# Patient Record
Sex: Female | Born: 1990 | Race: White | Hispanic: No | Marital: Married | Smoking: Former smoker
Health system: Southern US, Community
[De-identification: ages and names within clinical notes are randomized; demographics above are authoritative.]

## PROBLEM LIST (undated history)

## (undated) ENCOUNTER — Observation Stay: Admission: AD | Payer: Medicaid Other | Source: Ambulatory Visit | Admitting: Obstetrics & Gynecology

## (undated) DIAGNOSIS — F419 Anxiety disorder, unspecified: Secondary | ICD-10-CM

## (undated) DIAGNOSIS — D649 Anemia, unspecified: Secondary | ICD-10-CM

## (undated) DIAGNOSIS — I10 Essential (primary) hypertension: Secondary | ICD-10-CM

## (undated) DIAGNOSIS — O139 Gestational [pregnancy-induced] hypertension without significant proteinuria, unspecified trimester: Secondary | ICD-10-CM

## (undated) HISTORY — PX: DRUG INDUCED ENDOSCOPY: SHX6808

## (undated) HISTORY — PX: WISDOM TOOTH EXTRACTION: SHX21

## (undated) HISTORY — DX: Gestational (pregnancy-induced) hypertension without significant proteinuria, unspecified trimester: O13.9

## (undated) HISTORY — DX: Anxiety disorder, unspecified: F41.9

## (undated) HISTORY — DX: Anemia, unspecified: D64.9

## (undated) HISTORY — PX: ENDOSCOPY UPPER GI: SHX510245

## (undated) HISTORY — DX: Essential (primary) hypertension: I10

---
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
## 2012-03-25 NOTE — Op Note (Unsigned)
DATE OF BIRTH:                        05-Mar-1991      ADMISSION DATE:                     05/08/2004            PATIENT LOCATION:                    [id number]            DATE OF PROCEDURE:                   05/09/2004      SURGEON:                            Marguerite Olea, MD      ASSISTANT(S):                  PREOPERATIVE DIAGNOSIS:  ESOPHAGEAL FOREIGN BODY.            POSTOPERATIVE DIAGNOSIS:  STATUS POST REMOVAL OF ESOPHAGEAL FOREIGN BODY.            PROCEDURE:  ESOPHAGOGASTRODUODENOSCOPY WITH FOREIGN BODY REMOVAL.            INDICATIONS:  The patient is a 22 year old female who swallowed a guitar      pick yesterday.  She presented with dysphagia, drooling and chest pain.            DESCRIPTION OF PROCEDURE:  The instrument was done under intravenous      sedation provided by the anesthesiology department.  The patient was placed      in the left lateral decubitus position.  The Olympus endoscope was      carefully induced into the oropharynx and advanced down the esophagus.      There was a guitar pick present in the middle esophagus.  Mathieu forceps      were extended and the foreign body removed easily.  The scope was then      reintroduced. She had some minor superficial erosions in the middle of the      esophagus.  The scope was advanced down the length of the entire esophagus.      No other foreign bodies were identified.  The scope was then removed.  She      tolerated the procedure well and went back to recovery.            The plan is to advance diet as tolerated and discharge home if tolerating      p.o. well.                                                ___________________________________          Date Signed: __________      Marguerite Olea, MD  ([id number])            D: 05/09/2004 by Marguerite Olea, MD      T: 05/09/2004 by [id number] ([id number]) ([id number])      cc:  Marguerite Olea, MD

## 2013-04-05 ENCOUNTER — Emergency Department
Admission: EM | Admit: 2013-04-05 | Discharge: 2013-04-05 | Disposition: A | Discharge status: Home / Self Care | Payer: Enrolled Prime—HMO | Attending: Emergency Medicine | Admitting: Emergency Medicine

## 2013-04-05 ENCOUNTER — Emergency Department: Payer: Enrolled Prime—HMO

## 2013-04-05 DIAGNOSIS — O200 Threatened abortion: Secondary | ICD-10-CM | POA: Insufficient documentation

## 2013-04-05 LAB — CBC AND DIFFERENTIAL
Basophils Absolute Automated: 0.02 10*3/uL (ref 0.00–0.20)
Basophils Automated: 0 %
Eosinophils Absolute Automated: 0.12 10*3/uL (ref 0.00–0.70)
Eosinophils Automated: 1 %
Hematocrit: 34.6 % — ABNORMAL LOW (ref 37.0–47.0)
Hgb: 11.9 g/dL — ABNORMAL LOW (ref 12.0–16.0)
Immature Granulocytes Absolute: 0.03 10*3/uL
Immature Granulocytes: 0 %
Lymphocytes Absolute Automated: 2.03 10*3/uL (ref 0.50–4.40)
Lymphocytes Automated: 22 %
MCH: 29.8 pg (ref 28.0–32.0)
MCHC: 34.4 g/dL (ref 32.0–36.0)
MCV: 86.5 fL (ref 80.0–100.0)
MPV: 9.2 fL — ABNORMAL LOW (ref 9.4–12.3)
Monocytes Absolute Automated: 0.62 10*3/uL (ref 0.00–1.20)
Monocytes: 7 %
Neutrophils Absolute: 6.43 10*3/uL (ref 1.80–8.10)
Neutrophils: 70 %
Nucleated RBC: 0 (ref 0–1)
Platelets: 233 10*3/uL (ref 140–400)
RBC: 4 10*6/uL — ABNORMAL LOW (ref 4.20–5.40)
RDW: 11 % — ABNORMAL LOW (ref 12–15)
WBC: 9.22 10*3/uL (ref 3.50–10.80)

## 2013-04-05 LAB — HCG QUANTITATIVE: hCG, Quant.: 49806.5

## 2013-04-05 LAB — TYPE AND SCREEN
AB Screen Gel: NEGATIVE
ABO Rh: A POS

## 2013-04-05 NOTE — ED Provider Notes (Signed)
Physician/Midlevel provider first contact with patient: 04/05/13 1610         EMERGENCY DEPARTMENT HISTORY AND PHYSICAL EXAM    Patient Name: Rhonda Rhonda [name]  Encounter Date:  04/05/2013  Attending Physician: Elray Mcgregor, MD  Patient DOB:  10-Jan-1991  MRN:  [id number]  Room:  01B/A01B    History of Presenting Illness     Historian: Pt    The patient Rhonda Rhonda [name] is Rhonda [name] P1G0Ab0 23 y.o. female [redacted] weeks pregnant p/w intermittent, pink, mild vaginal bleeding since 2 hours ago. Associated with cramping abd pain. No fever or nausea.    OB: Rhonda Rhonda [name]  PMD:  No primary provider on file.    Nursing Notes Review  Nursing Notes were reviewed.     Previous Records Review  Previous records were reviewed to the extent practicable for the current presentation.    Past Medical History     History reviewed. No pertinent past medical history.    No current facility-administered medications for this encounter.  Current outpatient prescriptions:Prenatal Vit-Fe Fumarate-FA (PRENATAL PO), Take by mouth., Disp: , Rfl:     No Known Allergies    Past Surgical History     Past Surgical History   Procedure Date   . Endoscopy upper gi        Family History   The family history is not significantly contributory to current presentation.   History reviewed. No pertinent family history.    Social History   Social history is not significantly contributory to the patient's current presentation.   History     Social History   . Marital Status: Single     Spouse Name: N/Rhonda [name]     Number of Children: N/Rhonda [name]   . Years of Education: N/Rhonda [name]     Social History Main Topics   . Smoking status: Former Games developer   . Smokeless tobacco: Not on file   . Alcohol Use: Yes      Comment: quit since pregnancy   . Drug Use: Yes      Comment: before pregnancy   . Sexually Active: Not on file     Other Topics Concern   . Not on file     Social History Narrative   . No narrative on file       Home Medications     Home medications reviewed by ED MD at 7:23 AM      Previous Medications    PRENATAL VIT-FE FUMARATE-FA (PRENATAL PO)    Take by mouth.       Review of Systems     See HPI for review of systems that is relevant to the current presentation.   All other systems reviewed: negative.     Physical Exam     Vital Signs  BP 125/63  Pulse 82  Temp 97.9 F (36.6 C)  Resp 16  Ht 1.651 m  Wt 69.4 kg  BMI 25.46 kg/m2  SpO2 99%  LMP 01/05/2013    Review of Vital Signs  The patient's vital signs were reviewed and interpreted by me, Elray Mcgregor, MD.    Physical Exam    Constitutional: No acute distress. Not ashen.   Eyes: No conjunctival pallor. No conjunctival discharge. EOMI.   Head, Ears, Nose, Mouth, Throat: Normocephalic. Moist mucous membranes.   Neck: Full range of motion. No obvious neck deformities. No tracheal deviation.   Cardiovascular: RRR. No obvious friction rubs or gallops. Normal capillary refill.   Respiratory/Chest: CTAB. No obvious  chest wall asymmetry. No resp distress.    Gastrointestinal/Abdominal:  No abdominal tenderness. Soft abdomen. No abdominal distention.   Musculoskeletal: Normal ROM. No focal extremity tenderness.   Back: No deformity. No significant scoliosis.   Neurological: Alert. No acute focal deficits.   Skin: Warm skin. No acute rash.     ED Medications Administered     ED Medication Orders     None          Orders Placed During This Encounter     Orders Placed This Encounter   Procedures   . US OB < 14 Weeks W Transvag   . UA, Reflex to Microscopic   . CBC and differential   . Beta HCG, Quant, Serum   . Diet NPO effective now   . Type and Screen   . Saline lock IV       Diagnostic Study Results     The results of the diagnostic studies below were reviewed by the ED provider:    Labs  Results     Procedure Component Value Units Date/Time    Type and Screen [[id number]] Collected:04/05/13 0647    Specimen Information:Blood Updated:04/05/13 0744     ABO Rh Rhonda [name] POS      AB Screen Gel NEG     Beta HCG, Quant, Serum [[id number]]  Collected:04/05/13 0647     hCG, Quant. 14782.9 Updated:04/05/13 0733    CBC and differential [[id number]]  (Abnormal) Collected:04/05/13 0647    Specimen Information:Blood / Blood Updated:04/05/13 0658     WBC 9.22 x10 3/uL      RBC 4.00 (L) x10 6/uL      Hgb 11.9 (L) g/dL      Hematocrit 57.8 (L) %      MCV 86.5 fL      MCH 29.8 pg      MCHC 34.4 g/dL      RDW 11 (L) %      Platelets 233 x10 3/uL      MPV 9.2 (L) fL      Neutrophils 70 %      Lymphocytes Automated 22 %      Monocytes 7 %      Eosinophils Automated 1 %      Basophils Automated 0 %      Immature Granulocyte 0 %      Nucleated RBC 0      Neutrophils Absolute 6.43 x10 3/uL      Abs Lymph Automated 2.03 x10 3/uL      Abs Mono Automated 0.62 x10 3/uL      Abs Eos Automated 0.12 x10 3/uL      Absolute Baso Automated 0.02 x10 3/uL      Absolute Immature Granulocyte 0.03 x10 3/uL           Radiologic Studies  Radiology Results (24 Hour)     Procedure Component Value Units Date/Time    US OB < 14 Rhonda Rhonda [name] Transvag [[id number]] Collected:04/05/13 0719    Order Status:Completed  Updated:04/05/13 0727    Narrative:    INDICATION: 23 year old pregnant female presents with vaginal bleeding.  Threatened abortion.  Rule out ectopic pregnancy.LMP: 01/05/2013. EDC  based on LMP: 10/12/2013. Gestational age based on LMP:12 weeks and 6  days.         COMPARISON: No available prior study with which to compare.     TECHNIQUE: OBSTETRICAL ULTRASOUND: Real-time transabdominal and  transvaginal scans of the pelvis was performed with the help of Doppler.  FINDINGS: The uterus is anteverted measuring approximately 14.9 x 6.9 x  10.4 cm.  There is Rhonda [name] single live intrauterine gestation.     Fetal  cardiac rate is 158 BPM. No subchorionic hemorrhage is appreciated. The  cervix is closed. Amniotic fluid volume is qualitatively normal. Fetal  anatomy cannot be assessed at this early stage of gestation.          The right ovary measures 2.1 x 2.3 x 1.9 cm. The left ovary measures  3.1  x 2.4 x 1.9 cm.         The crown rump length is: 5.8 cm  The gestational age estimate by crown-rump length is:  12 weeks and 3  days  EDC by ultrasound is 10/16/2013.          Impression:      Obstetrical ultrasound shows an intrauterine gestation. The  single living intrauterine gestation has Rhonda [name] gestational age estimate of  12 weeks and 3 days.     Neldon Mc, MD   04/05/2013 7:23 AM          Scribe and MD Attestations     I, Elray Mcgregor, MD, personally performed the services documented. Crisoforo Oxford is scribing for me on O'DONNELL,Rhonda Rhonda [name]. I reviewed and confirm the accuracy of the information in this medical record.     I, Crisoforo Oxford, am serving as Rhonda [name] Neurosurgeon to document services personally performed by Elray Mcgregor, MD, based on the provider's statements to me.     Rendering Provider: Elray Mcgregor, MD    ED Course, Monitors, EKG, Critical Care, Splints, Consults, Reevaluation, etc     n/Rhonda [name]    MDM and Clinical Notes     Hx and Exam Synthesis and Differential Diagnosis     MDM: First trimester bleeding. Unlikely to represent miscarriage. Pelvic exam is low yield at this point given that pt has already had her [place] and minimal bleeding.     Plan    Plan: [place]. Labs. Blood type.    Return Precautions:    I counseled extensively on nature of problem--voiced understanding, agrees to follow up. Given strict return precautions--fully understands:   Pt is to return immediately to emergency department if any worsening symptoms at all--otherwise, return to the emergency department within 2 days for Rhonda [name] recheck or follow up with obstetrician within 2 days.    Diagnosis and Disposition     Clinical Impression  1. Threatened miscarriage        Disposition  ED Disposition     Discharge Rhonda Rhonda [name] discharge to home/self care.    Condition at disposition: Stable            Prescriptions     New Prescriptions    No medications on file         Elray Mcgregor, MD  04/05/13 1454

## 2013-04-05 NOTE — Discharge Instructions (Signed)
Dear Mitchel Honour,    Return immediately to the emergency room if you have any worsening symptoms!!! Otherwise, follow up with your obstetrician in the next 2 days, or return to the emergency department in 2 days if you have any trouble getting an appointment.     Thank you for choosing [facility] for your emergency care needs.  We strive to provide EXCELLENT care to you and your family.      If you do not continue to improve or your condition worsens, please contact your doctor or return immediately to the Emergency Department.    DOCTOR REFERRALS  Call [phone] (available 24 hours a day, 7 days a week) if you need any further referrals and we can help you find a primary care doctor or specialist.  Also, available online at:  [email or web address]    YOUR CONTACT INFORMATION  Before leaving please check with registration to make sure we have an up-to-date contact number.  You can call registration at [phone] to update your information.  For questions about your hospital bill, please call [phone].  For questions about your Emergency Dept Physician bill please call [phone].      FREE HEALTH SERVICES  If you need help with health or social services, please call 2-1-1 for a free referral to resources in your area.  2-1-1 is a free service connecting people with information on health insurance, free clinics, pregnancy, mental health, dental care, food assistance, housing, and substance abuse counseling.  Also, available online at:  http://www.[facility].org    MEDICAL RECORDS AND TESTS  Certain laboratory test results do not come back the same day, for example urine cultures.   We will contact you if other important findings are noted.  Radiology films are often reviewed again to ensure accuracy.  If there is any discrepancy, we will notify you.      Please call [phone] to pick up a complimentary CD of any radiology studies performed.  If you or your  doctor would like to request a copy of your medical records, please call [phone].      ORTHOPEDIC INJURY   Please know that significant injuries can exist even when an initial x-ray is read as normal or negative.  This can occur because some fractures (broken bones) are not initially visible on x-rays.  For this reason, close outpatient follow-up with your primary care doctor or bone specialist (orthopedist) is required.    MEDICATIONS AND FOLLOWUP  Please be aware that some prescription medications can cause drowsiness.  Use caution when driving or operating machinery.    The examination and treatment you have received in our Emergency Department is provided on an emergency basis, and is not intended to be a substitute for your primary care physician.  It is important that your doctor checks you again and that you report any new or remaining problems at that time.      24 HOUR PHARMACIES  [facility] - [address] [place], [place] [address] (1.4 miles, 7 minutes)  [facility] - [address] [place], [place] [address] (6.5 miles, 13 minutes)  Handout with directions available on request

## 2013-04-05 NOTE — ED Notes (Signed)
Pt states awakened at 0500 and noted some light pink vaginal drainage on tissue after urination; pt is [redacted] weeks pregnant; c/o some cramping; pt has had an [place] which shows normal pregnancy.

## 2013-04-05 NOTE — ED Notes (Signed)
This nurse seeing patient for first time.

## 2016-03-23 NOTE — L&D Delivery Note (Signed)
Delivery Summary for Rhonda [name],  No LMP recorded. Patient is pregnant., Estimated Date of Delivery: 10/01/16    Pt admitted at: 09/24/2016  6:49 PM    Patient Active Problem List   Diagnosis   . [redacted] weeks gestation of pregnancy   . Pregnancy with 39 completed weeks gestation       Time of Delivery: 0414  09/25/2016     Attending/Supervising MD:  Dionicia Abler    Delivering Provider: Dionicia Abler    Procedure: Spontaneous vaginal delivery or Repair first degree spontaneous laceration    Anesthesia: Epidural     A female  infant was delivered from  [facility].    Infant Wt:      8 lb 4.3 oz (3750 g)    Infant Name:   Terese Door      Apgars:  8  at and 9  at 5 minutes    ROM Time: 0311 09/25/16  Amniotic Fluid: Clear       Placenta and Cord:          Mechanism: spontaneous      Description:  intact    Episiotomy: None     Lacerations: Yes    Laceration Type: 1st    Episiotomy/Laceration Repair:  2-0 vicryl in layers and   Small labial lacerations repaired subcutaneously     Estimated Blood Loss:  100cc's        Specimens: None  Disposition: discarded           Complications:  none  Sponge and instrument counts correct.

## 2016-04-16 DIAGNOSIS — A749 Chlamydial infection, unspecified: Secondary | ICD-10-CM

## 2016-04-16 HISTORY — DX: Chlamydial infection, unspecified: A74.9

## 2016-04-16 LAB — RUBELLA ANTIBODY, IGG: Rubella AB, IgG: IMMUNE

## 2016-04-16 LAB — RPR: RPR: NONREACTIVE

## 2016-04-16 LAB — HIV AG/AB 4TH GENERATION: HIV Ag/Ab, 4th Generation: NONREACTIVE

## 2016-04-16 LAB — HEPATITIS B SURFACE ANTIGEN W/ REFLEX TO CONFIRMATION: Hepatitis B Surface Antigen: NEGATIVE

## 2016-06-12 LAB — GONOCOCCUS CULTURE
Chlamydia trachomatis Culture: NEGATIVE
Culture Gonorrhoeae: NEGATIVE

## 2016-06-30 ENCOUNTER — Observation Stay: Payer: Medicaid Other

## 2016-06-30 ENCOUNTER — Observation Stay: Payer: Medicaid Other | Admitting: Obstetrics & Gynecology

## 2016-06-30 ENCOUNTER — Observation Stay
Admission: AD | Admit: 2016-06-30 | Discharge: 2016-06-30 | Disposition: A | Discharge status: Home / Self Care | Payer: Medicaid Other | Source: Ambulatory Visit | Attending: Obstetrics & Gynecology | Admitting: Obstetrics & Gynecology

## 2016-06-30 ENCOUNTER — Encounter (HOSPITAL_BASED_OUTPATIENT_CLINIC_OR_DEPARTMENT_OTHER): Payer: Self-pay

## 2016-06-30 DIAGNOSIS — Z3A26 26 weeks gestation of pregnancy: Secondary | ICD-10-CM | POA: Insufficient documentation

## 2016-06-30 DIAGNOSIS — N898 Other specified noninflammatory disorders of vagina: Principal | ICD-10-CM | POA: Insufficient documentation

## 2016-06-30 DIAGNOSIS — O99012 Anemia complicating pregnancy, second trimester: Secondary | ICD-10-CM | POA: Insufficient documentation

## 2016-06-30 DIAGNOSIS — O162 Unspecified maternal hypertension, second trimester: Secondary | ICD-10-CM | POA: Insufficient documentation

## 2016-06-30 DIAGNOSIS — O26892 Other specified pregnancy related conditions, second trimester: Secondary | ICD-10-CM | POA: Insufficient documentation

## 2016-06-30 LAB — URINALYSIS WITH MICROSCOPIC
Bilirubin, UA: NEGATIVE
Blood, UA: NEGATIVE
Glucose, UA: NEGATIVE
Ketones UA: NEGATIVE
Leukocyte Esterase, UA: NEGATIVE
Nitrite, UA: NEGATIVE
Protein, UR: NEGATIVE
Specific Gravity UA: 1.009 (ref 1.001–1.035)
Urine pH: 8 (ref 5.0–8.0)
Urobilinogen, UA: NORMAL mg/dL

## 2016-06-30 NOTE — Progress Notes (Signed)
UPDATE  Fern negative (but had used terazol recently)  Await sono

## 2016-06-30 NOTE — H&P (Signed)
Subjective:   Rhonda [name] is a 26 y.o. G2P1001 female at [redacted]w[redacted]d, Estimated Date of Delivery: 10/01/16.  She is being admitted for leakage of fluid.  This morning she noted a large puddle of fluid which gushed from her vagina and she has continued to lerak since that time.  Her current obstetrical history is significant for currently being under treatment for yeast with intravaginal Terazol  Patient reports rare mild contractions  Fetal Movement: normal.    Historical Data:  Past Medical History:   Diagnosis Date   . Anemia    . Chlamydia 04/16/2016   . Hypertension     chronic but not on meds and gestational with first pregnancy     Past Surgical History:   Procedure Laterality Date   . ENDOSCOPY UPPER GI     . WISDOM TOOTH EXTRACTION       OB History   Gravida Para Term Preterm AB Living   2 1 1  0 0 1   SAB TAB Ectopic Multiple Live Births   0 0 0 0 1      # Outcome Date GA Lbr Len/2nd Weight Sex Delivery Anes PTL Lv   2 Current            1 Term 10/03/13    F Vag-Spont EPI  LIV          Meds:  Prescriptions Prior to Admission   Medication Sig Dispense Refill Last Dose   . Prenatal Vit-Fe Fumarate-FA (PRENATAL PO) Take by mouth.   06/29/2016 at Unknown time   . terconazole (TERAZOL 7) 0.4 % vaginal cream Place 1 applicator vaginally nightly.   06/29/2016 at Unknown time         All        Vital signs in last 24 hours:  Temp:  [98.3 F (36.8 C)] 98.3 F (36.8 C)  Resp Rate:  [20] 20     FHTS: 140 Cat 1  Toco:  Rare contractions    General:   appears stated age, cooperative   Lungs:   Clear to auscultation bilaterally   Heart:    Regular rate and rhythm, no murmer, no rub, no gallop   Abdomen:   Soft, nonender, normal active bowel sounds, gravid uterus   SVE:  Sterile speculum exam with minimal white watery fluid at top of vault.  Cervix visually closed.     Lab Review:  Lab Results   Component Value Date    ABORH A POS 04/05/2013    RUBELLAABIGG Immune 04/16/2016    HEPBSAG Negative 04/16/2016         Assessment:   26 y.o. G2P1001 female at [redacted]w[redacted]d   Leakage of fluid   Currently using Terazol cream intravaginally, which limits ability to evaluate for PROM with vaginal testing   Recent BV and yeast infection    Plan:   Admit to L&D for evaluatioon   Fern test   [employer]   Sono to assess for remaining fluid and cervical length

## 2016-06-30 NOTE — Discharge Summary (Signed)
Discharge Dx:   26 week pregnancy  No evidence of PPROM  Sono:  ize consistent with dates, normal AFI and long cervix      Procedures performed:   Fetal monitoring  OB [facility] Course:   Uncomplicated                Discharge Medications:          Discharge Medication List as of 06/30/2016  3:56 PM           CONTINUE these medications which have NOT CHANGED    Details   Prenatal Vit-Fe Fumarate-FA (PRENATAL PO) Take by mouth., Until Discontinued, Historical Med   Terconazole to complete series          STOP taking these medications

## 2016-06-30 NOTE — Progress Notes (Signed)
Dr Dareen Piano given report on pt

## 2016-06-30 NOTE — Discharge Instr - AVS First Page (Signed)
Reason for your Hospital Admission:  Leaking fluid      Instructions for after your discharge:  Call MD if leak fluid or contract again

## 2016-06-30 NOTE — Progress Notes (Signed)
EFM & TOCO reapplied after [place] completed.

## 2016-06-30 NOTE — Progress Notes (Signed)
Pt given verbal [place] IS by Dr Dareen Piano and left prior to being given written [place] IS.  Pt [place] to home undelivered.

## 2016-06-30 NOTE — Progress Notes (Signed)
Dr Dareen Piano in and exam completed.

## 2016-06-30 NOTE — Hospital Course (Signed)
DISCHARGE NOTE    Date Time: 06/30/16 4:03 PM  Patient Name: Rhonda [name],Rhonda [name]  Attending Physician: No att. providers found    Date of Admission:   06/30/2016    Date of Discharge:   06/30/16    Reason for Admission:   Encounter for supervision of other normal pregnancy, unspecified trimester [Z34.80]   G2P1 at 26 5/7 weeks leaking fluid.  Currently under treatment for vaginal yeast infection so couldn't rely on ROM test.  Sterile spec exam revealed [name] small amount of thin white vafluid in vagina.  Fern negative.  Sono showed normal AFI = 16 .4.  Sono consistent with dates.  Cervical length = 4.0 cm.  Did not leak fluid during entire hospitalization. UA negative, urine culture pnd.    Problems:   Lists the present on admission hospital problems  Leaked fluid of uncertain origin    Hospital Problems:  Active Problems:    [redacted] weeks gestation of pregnancy      Problem Lists:  Patient Active Problem List   Diagnosis   . [redacted] weeks gestation of pregnancy                  Discharge Dx:   26 week pregnancy  No evidence of PPROM  Sono:  ize consistent with dates, normal AFI and long cervix      Procedures performed:   Fetal monitoring  OB [facility] Course:   Uncomplicated     Discharge Medications:     Discharge Medication List as of 06/30/2016  3:56 PM      CONTINUE these medications which have NOT CHANGED    Details   Prenatal Vit-Fe Fumarate-FA (PRENATAL PO) Take by mouth., Until Discontinued, Historical Med   Terconazole to complete series      STOP taking these medications                       Discharge Instructions:       Follow-up with Ob/Gyn in 6 - 7 days   To call MD if symptoms return      Signed by: Daine Gip          Relevant Summary for Hospital Course:

## 2016-06-30 NOTE — Progress Notes (Signed)
Dr Dareen Piano called to give her [place] report on pt.  States she will be in shortly to see pt

## 2016-06-30 NOTE — Progress Notes (Signed)
[place] here.  Monitor dcd.

## 2016-06-30 NOTE — Progress Notes (Signed)
Patient is a 25y/o g2p1 with a edc of 10/01/16. She came in with the c/o leaking fluid this morning.  EFM/EUM applied and explained. History taken.

## 2016-06-30 NOTE — Progress Notes (Signed)
[place] tech @ BS performing bedside US on pt

## 2016-08-26 LAB — GROUP B STREP TRANSCRIBED: GBS Transcribed: NEGATIVE

## 2016-09-10 ENCOUNTER — Ambulatory Visit: Payer: Self-pay

## 2016-09-24 ENCOUNTER — Inpatient Hospital Stay
Admission: RE | Admit: 2016-09-24 | Discharge: 2016-09-27 | DRG: 560 | Disposition: A | Discharge status: Home / Self Care | Payer: Medicaid Other | Source: Ambulatory Visit | Attending: Obstetrics & Gynecology | Admitting: Obstetrics & Gynecology

## 2016-09-24 ENCOUNTER — Inpatient Hospital Stay: Admission: RE | Admit: 2016-09-24 | Payer: Medicaid Other | Source: Ambulatory Visit

## 2016-09-24 DIAGNOSIS — O3663X0 Maternal care for excessive fetal growth, third trimester, not applicable or unspecified: Principal | ICD-10-CM | POA: Diagnosis present

## 2016-09-24 DIAGNOSIS — Z3A39 39 weeks gestation of pregnancy: Secondary | ICD-10-CM

## 2016-09-24 DIAGNOSIS — Z87891 Personal history of nicotine dependence: Secondary | ICD-10-CM

## 2016-09-24 LAB — CBC AND DIFFERENTIAL
Absolute NRBC: 0 10*3/uL
Basophils Absolute Automated: 0.04 10*3/uL (ref 0.00–0.20)
Basophils Automated: 0.4 %
Eosinophils Absolute Automated: 0.13 10*3/uL (ref 0.00–0.70)
Eosinophils Automated: 1.5 %
Hematocrit: 32.1 % — ABNORMAL LOW (ref 37.0–47.0)
Hgb: 10.6 g/dL — ABNORMAL LOW (ref 12.0–16.0)
Immature Granulocytes Absolute: 0.06 10*3/uL — ABNORMAL HIGH
Immature Granulocytes: 0.7 %
Lymphocytes Absolute Automated: 2.73 10*3/uL (ref 0.50–4.40)
Lymphocytes Automated: 30.6 %
MCH: 29.5 pg (ref 28.0–32.0)
MCHC: 33 g/dL (ref 32.0–36.0)
MCV: 89.4 fL (ref 80.0–100.0)
MPV: 10.1 fL (ref 9.4–12.3)
Monocytes Absolute Automated: 0.75 10*3/uL (ref 0.00–1.20)
Monocytes: 8.4 %
Neutrophils Absolute: 5.21 10*3/uL (ref 1.80–8.10)
Neutrophils: 58.4 %
Nucleated RBC: 0 /100 WBC (ref 0.0–1.0)
Platelets: 197 10*3/uL (ref 140–400)
RBC: 3.59 10*6/uL — ABNORMAL LOW (ref 4.20–5.40)
RDW: 12 % (ref 12–15)
WBC: 8.92 10*3/uL (ref 3.50–10.80)

## 2016-09-24 LAB — TYPE AND SCREEN
AB Screen Gel: NEGATIVE
ABO Rh: A POS

## 2016-09-24 MED ORDER — TERBUTALINE SULFATE 1 MG/ML IJ SOLN
0.2500 mg | Freq: Once | INTRAMUSCULAR | Status: DC | PRN
Start: 2016-09-24 — End: 2016-09-25

## 2016-09-24 MED ORDER — ONDANSETRON 4 MG PO TBDP
4.0000 mg | ORAL_TABLET | Freq: Three times a day (TID) | ORAL | Status: DC | PRN
Start: 2016-09-24 — End: 2016-09-27

## 2016-09-24 MED ORDER — PROMETHAZINE HCL 25 MG/ML IJ SOLN
6.2500 mg | Freq: Four times a day (QID) | INTRAMUSCULAR | Status: DC | PRN
Start: 2016-09-24 — End: 2016-09-25

## 2016-09-24 MED ORDER — PROMETHAZINE HCL 25 MG RE SUPP
12.5000 mg | Freq: Four times a day (QID) | RECTAL | Status: DC | PRN
Start: 2016-09-24 — End: 2016-09-25

## 2016-09-24 MED ORDER — CALCIUM CARBONATE ANTACID 500 MG PO CHEW
500.0000 mg | CHEWABLE_TABLET | Freq: Four times a day (QID) | ORAL | Status: DC | PRN
Start: 2016-09-24 — End: 2016-09-27
  Administered 2016-09-24: 500 mg via ORAL
  Filled 2016-09-24 (×2): qty 1

## 2016-09-24 MED ORDER — DINOPROSTONE 10 MG VA INST
10.0000 mg | VAGINAL_INSERT | Freq: Once | VAGINAL | Status: AC
Start: 2016-09-24 — End: 2016-09-24
  Administered 2016-09-24: 19:00:00 10 mg via VAGINAL
  Filled 2016-09-24: qty 1

## 2016-09-24 MED ORDER — ACETAMINOPHEN 325 MG PO TABS
650.0000 mg | ORAL_TABLET | Freq: Four times a day (QID) | ORAL | Status: DC | PRN
Start: 2016-09-24 — End: 2016-09-25

## 2016-09-24 MED ORDER — FENTANYL-BUPIVACAINE-NACL 0.2-0.125-0.9 MG/100ML-% EP SOLN
EPIDURAL | Status: DC
Start: 2016-09-25 — End: 2016-09-25
  Administered 2016-09-25: 01:00:00 10 mL/h via EPIDURAL
  Filled 2016-09-24: qty 100

## 2016-09-24 MED ORDER — PROMETHAZINE HCL 25 MG PO TABS
12.5000 mg | ORAL_TABLET | Freq: Four times a day (QID) | ORAL | Status: DC | PRN
Start: 2016-09-24 — End: 2016-09-25

## 2016-09-24 MED ORDER — ONDANSETRON HCL 4 MG/2ML IJ SOLN
4.0000 mg | Freq: Three times a day (TID) | INTRAMUSCULAR | Status: DC | PRN
Start: 2016-09-24 — End: 2016-09-27

## 2016-09-24 MED ORDER — ACETAMINOPHEN 650 MG RE SUPP
650.0000 mg | Freq: Four times a day (QID) | RECTAL | Status: DC | PRN
Start: 2016-09-24 — End: 2016-09-25

## 2016-09-24 MED ORDER — SODIUM CHLORIDE 0.9 % IJ SOLN
3.0000 mL | Freq: Three times a day (TID) | INTRAMUSCULAR | Status: DC
Start: 2016-09-24 — End: 2016-09-25

## 2016-09-24 NOTE — Progress Notes (Signed)
Patient now reports that she would like to wait a little bit before getting the epidural. Anesthesia aware.

## 2016-09-24 NOTE — Anesthesia Preprocedure Evaluation (Signed)
Anesthesia Evaluation    AIRWAY    Mallampati: II    TM distance: >3 FB  Neck ROM: full  Mouth Opening:full   CARDIOVASCULAR    regular and normal       DENTAL    no notable dental hx     PULMONARY    clear to auscultation     OTHER FINDINGS              Relevant Problems   No relevant active problems               Anesthesia Plan    ASA 2     epidural                                 informed consent obtained    Plan discussed with CRNA.      pertinent labs reviewed             Signed by: Deneise Lever 09/24/16 11:47 PM

## 2016-09-24 NOTE — Progress Notes (Signed)
Patient here for cervical ripening, external EFM/UA monitors applied over active baby.  See OB history and physical for details.

## 2016-09-24 NOTE — H&P (Signed)
[facility] [facility] DELIV[facility] ADMISSION H&P    Date Time: 07/05/188:05 PM  Room#KLD02/KLD02-01    Name:Rhonda [name]     Chief Complaint   Patient presents with   . Scheduled Induction       Subjective:  26 y.o. G59P1001 female with [name] gestation age of [redacted]w[redacted]d and Estimated Date of Delivery: 10/01/16 who presents to the hospital for  Labor induction. Patient reports occasional contractions.   Her current obstetrical history is significant for  LGA     Obstetric History    G2   P1   T1   P0   A0   L1     SAB0   TAB0   Ectopic0   Multiple0   Live Births1       # Outcome Date GA Lbr Len/2nd Weight Sex Delivery Anes PTL Lv   2 Current            1 Term 10/03/13    F Vag-Spont EPI  LIV           Past Gyn History:     Past Medical History:   Diagnosis Date   . Anemia    . Chlamydia 04/16/2016   . Hypertension     chronic but not on meds and gestational with first pregnancy       Past Surgical History:   Procedure Laterality Date   . ENDOSCOPY UPPER GI     . WISDOM TOOTH EXTRACTION         No Known Allergies    Prior to Admission medications    Medication Sig Start Date End Date Taking? Authorizing Provider   Prenatal Vit-Fe Fumarate-FA (PRENATAL PO) Take by mouth.   Yes [provider]       Social History     Social History   . Marital status: Single     Spouse name: N/[name]   . Number of children: N/[name]   . Years of education: N/[name]     Occupational History   . Not on file.     Social History Main Topics   . Smoking status: Former Games developer   . Smokeless tobacco: Never Used   . Alcohol use No      Comment: quit since pregnancy   . Drug use: No   . Sexual activity: Not on file     Other Topics Concern   . Not on file     Social History Narrative   . No narrative on file         Review of Systems:   Negative, except as noted in HPI    Vital Signs:  Temp:  [98.2 F (36.8 C)-98.3 F (36.8 C)] 98.2 F (36.8 C)  Heart Rate:  [75-81] 81  Resp Rate:  [16-18] 18  BP: (119-130)/(78-81) 119/78    FHT:   FHR Category: Category  I  Baseline Rate: 135 BPM  Variability: Moderate  Contraction Frequency: 1.5-3  Contraction Quality: Mild, Moderate    Physical Exam:    General appearance - alert, well appearing, and in no distress  Mental status - alert, oriented to person, place, and time  Chest - clear to auscultation, no wheezes, rales or rhonchi, symmetric air entry  Heart - normal rate, regular rhythm, normal S1, S2, no murmurs, rubs, clicks or gallops  Abdomen - soft, non-tender, gravid  EFW:   Edema: Generalized Edema: None   Cervical Exam:  Dilation: .5  Effacement (%): 50  Cervical Characteristics: Posterior  Station: -2  Presentation: Vertex  Method: Manual  OB Examiner: Dr. [name] Guile Status: Intact      Labs:     Results     Procedure Component Value Units Date/Time    CBC and differential [[id number]]  (Abnormal) Collected:  09/24/16 1940    Specimen:  Blood from Blood Updated:  09/24/16 1957     WBC 8.92 x10 3/uL      Hgb 10.6 (L) g/dL      Hematocrit 40.9 (L) %      Platelets 197 x10 3/uL      RBC 3.59 (L) x10 6/uL      MCV 89.4 fL      MCH 29.5 pg      MCHC 33.0 g/dL      RDW 12 %      MPV 10.1 fL      Neutrophils 58.4 %      Lymphocytes Automated 30.6 %      Monocytes 8.4 %      Eosinophils Automated 1.5 %      Basophils Automated 0.4 %      Immature Granulocyte 0.7 %      Nucleated RBC 0.0 /100 WBC      Neutrophils Absolute 5.21 x10 3/uL      Abs Lymph Automated 2.73 x10 3/uL      Abs Mono Automated 0.75 x10 3/uL      Abs Eos Automated 0.13 x10 3/uL      Absolute Baso Automated 0.04 x10 3/uL      Absolute Immature Granulocyte 0.06 (H) x10 3/uL      Absolute NRBC 0.00 x10 3/uL     Type and screen [[id number]] Collected:  09/24/16 1940    Specimen:  Blood Updated:  09/24/16 1954          Lab Results   Component Value Date    HEPBSAG Negative 04/16/2016     No results found for: HIVAB  Lab Results   Component Value Date    RPR Nonreactive 04/16/2016     Rubella:  Immune  GBS: negative    Assessment  Patient is [name] 26 y.o.,   G2P1001 [redacted]w[redacted]d    Active Problems:    Pregnancy with 39 completed weeks gestation  LGA    Plan:   Admit for cervical ripening    Risks, benefits, alternatives and possible complications have been discussed in detail with the patient.  Pre-admission, admission, and post admission procedures and expectations were discussed in detail.          Ovidio Kin MD, FACOG  [id number]

## 2016-09-24 NOTE — Progress Notes (Signed)
Report received Merla Riches, RN. Patient care resumed.

## 2016-09-24 NOTE — Progress Notes (Signed)
Bedside report given to Fletcher Anon RN.

## 2016-09-24 NOTE — Plan of Care (Signed)
Problem: Vaginal/Cesarean Delivery  Goal: Intrapartum management of pain/discomfort  Patient reporting pain to be 4/10 on the numeric pain scale. Patient would like an epidural to manage pain during labor, although denies need for intervention at this time. Will continue to monitor.

## 2016-09-24 NOTE — Progress Notes (Signed)
RN paged to room. Patient reports increased contraction pain to 7/10 and requests pain medication. Cervix now 2-3/60/-1. Dr. Alan Mulder notified. Per MD- patient may have epidural; keep cervidil in place.

## 2016-09-25 ENCOUNTER — Observation Stay: Payer: Medicaid Other | Admitting: Pain Medicine

## 2016-09-25 ENCOUNTER — Inpatient Hospital Stay: Admission: RE | Admit: 2016-09-25 | Payer: Medicaid Other | Source: Ambulatory Visit

## 2016-09-25 MED ORDER — PROMETHAZINE HCL 25 MG RE SUPP
12.5000 mg | Freq: Four times a day (QID) | RECTAL | Status: DC | PRN
Start: 2016-09-25 — End: 2016-09-27

## 2016-09-25 MED ORDER — HYDROCORTISONE 1 % EX OINT
TOPICAL_OINTMENT | Freq: Three times a day (TID) | CUTANEOUS | Status: DC | PRN
Start: 2016-09-25 — End: 2016-09-27

## 2016-09-25 MED ORDER — LACTATED RINGERS IV SOLN
INTRAVENOUS | Status: DC | PRN
Start: 2016-09-25 — End: 2016-09-25

## 2016-09-25 MED ORDER — PROMETHAZINE HCL 25 MG PO TABS
12.5000 mg | ORAL_TABLET | Freq: Four times a day (QID) | ORAL | Status: DC | PRN
Start: 2016-09-25 — End: 2016-09-27

## 2016-09-25 MED ORDER — LACTATED RINGERS IV BOLUS
1000.0000 mL | Freq: Once | INTRAVENOUS | Status: AC
Start: 2016-09-25 — End: 2016-09-25
  Administered 2016-09-25: 1000 mL via INTRAVENOUS

## 2016-09-25 MED ORDER — DOCUSATE SODIUM 100 MG PO CAPS
200.0000 mg | ORAL_CAPSULE | Freq: Two times a day (BID) | ORAL | Status: DC
Start: 2016-09-25 — End: 2016-09-27
  Administered 2016-09-25 – 2016-09-26 (×3): 200 mg via ORAL
  Filled 2016-09-25 (×3): qty 2

## 2016-09-25 MED ORDER — ONDANSETRON 4 MG PO TBDP
4.0000 mg | ORAL_TABLET | Freq: Three times a day (TID) | ORAL | Status: DC | PRN
Start: 2016-09-25 — End: 2016-09-25

## 2016-09-25 MED ORDER — MISOPROSTOL 200 MCG PO TABS
800.0000 ug | ORAL_TABLET | Freq: Once | ORAL | Status: DC | PRN
Start: 2016-09-25 — End: 2016-09-27

## 2016-09-25 MED ORDER — LACTATED RINGERS IV SOLN
INTRAVENOUS | Status: DC
Start: 2016-09-25 — End: 2016-09-25

## 2016-09-25 MED ORDER — METHYLERGONOVINE MALEATE 0.2 MG/ML IJ SOLN
200.0000 ug | INTRAMUSCULAR | Status: DC | PRN
Start: 2016-09-25 — End: 2016-09-27

## 2016-09-25 MED ORDER — MEASLES, MUMPS & RUBELLA VAC SC INJ
0.5000 mL | INJECTION | SUBCUTANEOUS | Status: DC | PRN
Start: 2016-09-25 — End: 2016-09-27

## 2016-09-25 MED ORDER — OXYCODONE-ACETAMINOPHEN 5-325 MG PO TABS
1.0000 | ORAL_TABLET | Freq: Once | ORAL | Status: DC | PRN
Start: 2016-09-25 — End: 2016-09-25

## 2016-09-25 MED ORDER — LANOLIN EX OINT
TOPICAL_OINTMENT | CUTANEOUS | Status: DC | PRN
Start: 2016-09-25 — End: 2016-09-27

## 2016-09-25 MED ORDER — BUPIVACAINE HCL (PF) 0.25 % IJ SOLN
INTRAMUSCULAR | Status: DC | PRN
Start: 2016-09-25 — End: 2016-09-25
  Administered 2016-09-25 (×2): 2 mL via EPIDURAL

## 2016-09-25 MED ORDER — PROMETHAZINE HCL 25 MG/ML IJ SOLN
6.2500 mg | Freq: Four times a day (QID) | INTRAMUSCULAR | Status: DC | PRN
Start: 2016-09-25 — End: 2016-09-27

## 2016-09-25 MED ORDER — ONDANSETRON HCL 4 MG/2ML IJ SOLN
4.0000 mg | Freq: Three times a day (TID) | INTRAMUSCULAR | Status: DC | PRN
Start: 2016-09-25 — End: 2016-09-25

## 2016-09-25 MED ORDER — IBUPROFEN 600 MG PO TABS
600.0000 mg | ORAL_TABLET | Freq: Once | ORAL | Status: AC | PRN
Start: 2016-09-25 — End: 2016-09-25
  Administered 2016-09-25: 06:00:00 600 mg via ORAL
  Filled 2016-09-25: qty 1

## 2016-09-25 MED ORDER — WITCH HAZEL-GLYCERIN EX PADS
1.0000 | MEDICATED_PAD | CUTANEOUS | Status: DC | PRN
Start: 2016-09-25 — End: 2016-09-27
  Administered 2016-09-25: 13:00:00 1 via TOPICAL
  Filled 2016-09-25: qty 40

## 2016-09-25 MED ORDER — OXYTOCIN-SODIUM CHLORIDE 30-0.9 UT/500ML-% IV SOLN
7.5000 [IU]/h | INTRAVENOUS | Status: DC
Start: 2016-09-25 — End: 2016-09-27

## 2016-09-25 MED ORDER — OXYCODONE-ACETAMINOPHEN 5-325 MG PO TABS
1.0000 | ORAL_TABLET | Freq: Four times a day (QID) | ORAL | Status: DC | PRN
Start: 2016-09-25 — End: 2016-09-27

## 2016-09-25 MED ORDER — NALOXONE HCL 0.4 MG/ML IJ SOLN (WRAP)
0.1000 mg | INTRAMUSCULAR | Status: DC | PRN
Start: 2016-09-25 — End: 2016-09-25

## 2016-09-25 MED ORDER — BENZOCAINE 20% +/- MENTHOL 0.5% EX AERO (WRAP)
1.0000 | INHALATION_SPRAY | CUTANEOUS | Status: DC | PRN
Start: 2016-09-25 — End: 2016-09-27
  Administered 2016-09-25: 13:00:00 1 via TOPICAL
  Filled 2016-09-25: qty 56

## 2016-09-25 MED ORDER — TETANUS-DIPHTH-ACELL PERTUSSIS 5-2.5-18.5 LF-MCG/0.5 IM SUSP
0.5000 mL | INTRAMUSCULAR | Status: DC | PRN
Start: 2016-09-25 — End: 2016-09-27

## 2016-09-25 MED ORDER — SOD CITRATE-CITRIC ACID 500-334 MG/5ML PO SOLN
30.0000 mL | Freq: Once | ORAL | Status: AC | PRN
Start: 2016-09-25 — End: 2016-09-25
  Administered 2016-09-25: 01:00:00 30 mL via ORAL
  Filled 2016-09-25: qty 30

## 2016-09-25 MED ORDER — IBUPROFEN 600 MG PO TABS
600.0000 mg | ORAL_TABLET | Freq: Four times a day (QID) | ORAL | Status: DC | PRN
Start: 2016-09-25 — End: 2016-09-27
  Administered 2016-09-25 – 2016-09-27 (×8): 600 mg via ORAL
  Filled 2016-09-25 (×8): qty 1

## 2016-09-25 MED ORDER — PRENATAL AD PO TABS
1.0000 | ORAL_TABLET | Freq: Every day | ORAL | Status: DC
Start: 2016-09-25 — End: 2016-09-27
  Administered 2016-09-26: 10:00:00 1 via ORAL

## 2016-09-25 MED ORDER — OXYTOCIN-SODIUM CHLORIDE 30-0.9 UT/500ML-% IV SOLN
INTRAVENOUS | Status: AC
Start: 2016-09-25 — End: 2016-09-25
  Filled 2016-09-25: qty 500

## 2016-09-25 MED ORDER — OXYTOCIN-SODIUM CHLORIDE 30-0.9 UT/500ML-% IV SOLN
7.5000 [IU]/h | INTRAVENOUS | Status: DC
Start: 2016-09-25 — End: 2016-09-25
  Administered 2016-09-25: 06:00:00 7.5 [IU]/h via INTRAVENOUS
  Administered 2016-09-25: 04:00:00 46.5 [IU]/h via INTRAVENOUS
  Filled 2016-09-25: qty 500

## 2016-09-25 MED ORDER — LIDOCAINE-EPINEPHRINE 1.5 %-1:200000 IJ SOLN
INTRAMUSCULAR | Status: DC | PRN
Start: 2016-09-25 — End: 2016-09-25
  Administered 2016-09-25 (×3): 2 mL via EPIDURAL

## 2016-09-25 NOTE — Progress Notes (Incomplete)
Rhonda [name]  [birth year]  26 y.o.     Delivery Date/Time: 09/25/2016  4:14 AM        Delivery Type:  Vaginal, Spontaneous Delivery             Laceration Type:     1st;Labial   Episiotomy:     None      GP: [id number] EBL: : 100 , 0    OB:    Dionicia Abler, MD  No Known Allergies    PRENATAL  Lab Results   Component Value Date    ABORH A POS 09/24/2016    GBS Negative 08/26/2016    HEPBSAG Negative 04/16/2016    RUBELLAABIGG Immune 04/16/2016    RPR Nonreactive 04/16/2016    CTRACHOMATCX Negative 06/12/2016       Past Medical History:   Diagnosis Date   . Anemia    . Chlamydia 04/16/2016    treated; tested negative 06/12/16   . Hypertension     chronic but not on meds and gestational with first pregnancy     Past Surgical History:   Procedure Laterality Date   . ENDOSCOPY UPPER GI     . WISDOM TOOTH EXTRACTION          Vaccine Screening:           Tdap:       Wants    Given    Declined   Had          Sch Meds:________________________      Stool Softeners_________________           PRN Meds: _______________________     PAIN REASSESSMENT DUE_______________________    IV:  []    Discharge Videos   []       Safety Contract:  []   ________________________________________________________________________    Hospital Peds: [facility]   Home Peds:  [facility]    H&P:  []         Baby Gender: Female   Gestation:  [redacted]w[redacted]d  APGAR: 8  and 9    Birth Weight: 8 lb 4.3 oz (3750 g)   Feeding Type:  Breast milk     Fenton Growth Scale:  82.55%    Void:  []        Stool:  []        Bath:  []        Hep B:  []      Eye & Thigh:  [x]        Blood Type: __________     Labs:___________________________________________________       Hear:     []    CID:  []                  TCB:  ______ @_____  hr   CCHD:  []        PKU:    []        Medimap []        CSC:    []        My Chart Completed   [x]      LACTATION    Lactation consult  - Ordered / Completed       Previous hx________________              Breast pump initiated  []         Nipple shield  []      P  Syringe  []        Breastfeeding class  []     Breast pump rental  [] 

## 2016-09-25 NOTE — Progress Notes (Signed)
Delivery of viable baby girl at 34.

## 2016-09-25 NOTE — Progress Notes (Signed)
Patient doing well, VSS, no headache, motor, sensory function intact. No epidural complaints/complications noted.    Haji Delaine, CRNA

## 2016-09-25 NOTE — Progress Notes (Signed)
Pt is doing well.  No post partum anesthesia issues.  Pt is ambulating well. No post-dural puncture headache. No residual weakness.     Rhonda Reeder C Filipescu-Turner, MD

## 2016-09-25 NOTE — Progress Notes (Signed)
Patient now requesting epidural. IV bolus begun

## 2016-09-25 NOTE — Progress Notes (Signed)
Report given to Catherine G, RN. Patient care released.

## 2016-09-25 NOTE — Progress Notes (Signed)
MOM     26 y.o.  [id number]  [redacted]w[redacted]d weeks  No Known Allergies  Her current pregnancy is significant for   Patient Active Problem List   Diagnosis   . [redacted] weeks gestation of pregnancy   . Pregnancy with 39 completed weeks gestation     Prior Delivery: induced vaginal    Past Medical History:   Diagnosis Date   . Anemia    . Chlamydia 04/16/2016    treated; tested negative 06/12/16   . Hypertension     chronic but not on meds and gestational with first pregnancy       Past Surgical History:   Procedure Laterality Date   . ENDOSCOPY UPPER GI     . WISDOM TOOTH EXTRACTION         Pertinent prenatal Labs -   Lab Results   Component Value Date    ABORH A POS 09/24/2016    HEPBSAG Negative 04/16/2016    GBS Negative 08/26/2016    RUBELLAABIGG Immune 04/16/2016    RPR Nonreactive 04/16/2016    CTRACHOMATCX Negative 06/12/2016    CULTUREGONOR Negative 06/12/2016       Interpreter Waiver Signed: N/A    Delivering Clinician:     Dr. Alan Mulder  Delivery Type:     Vaginal, Spontaneous Delivery   Delivery Date and Time: 09/25/2016  4:14 AM     Delivery Complications: none    Rupture Date - 09/25/2016   Rupture time - 3:11 AM   Fluid color - Clear     Laceration (Yes/None):     Yes   Laceration Type:     1st;Labial   Episiotomy:     None      Anesthesia:     Epidural      ADDITIONAL COMMENTS:    Prenatal Scanned into Epic: Yes    Ibuprofen given prior to transfer to [facility] (for vaginal deliveries only)- Yes  Other Medications given in L&D- Pitocin @ 125 ml/hr (2nd bag hanging)  Motrin 600 mg 0552      Recent Output- straight cath 0555 400 ml     Fundus- FF @ U/1  Lochia- small      EBL: 100 ,0     Pain Score: 2- discomfort with fundal exam only    Prolonged 2nd stage of labor  No       Precipitous labor < 3 hours  No       Prolong Oxytocin use  No         Patient up with SARASTEADY -  N/A            BABY    Security Cube- [id number]     Baby Safety Contract Signed Yes       Vaginal, Spontaneous Delivery  of live female  infant 8 lb 4.3 oz (3750 g)       1 Minute Apgar:     8      5 Minute Apgar:     9     Nuchal Cord:   n/a    Fenton Growth Scale Percentile: 82.55   >90% or <10% No  ; Hypoglycemia Protocol Initiated: No      ADDITIONAL COMMENTS:  Feeding: Breast milk   Skin-to-skin initiated Yes               Labs due- n/a  Cord blood- Lab/Blood Bank   Eyes & Thighs: done    Void - No   Stool - 0  Circumcision N/A    Pediatrician Notification:  If not house pediatrician, was private Pediatrician Notified: N/A, peds notification form filled out Yes    At home Pediatrician: [facility] Pediatrician: Derald Macleod      Bedside report given to Maren Beach, RN using ISHAPED report.  Safety checks done. Pt care relinquished.    Duane Lope  09/25/2016 0725

## 2016-09-25 NOTE — Plan of Care (Signed)
Problem: Vaginal/Cesarean Delivery  Goal: Postpartum management of pain/discomfort  Outcome: Progressing   09/25/16 0931   Goal/Interventions addressed this shift   Postpartum management of pain/discomfort Assess pain using a consistent, developmental/age appropriate pain scale;Assess pain level before and following intervention;Include patient/patient care companion in decisions related to pain management     Goal: Breasts are soft with nipple integrity intact  Outcome: Progressing   09/25/16 0931   Goal/Interventions addressed this shift   Breasts are soft with nipple integrity intact Perform breast/nipple assessment;Assess and manage engorgement;Breastfeed and/or pump breasts at least 8-12 times within 24 hours;Ensure proper positioning and latch     Goal: Gastrointestinal/Urinary management  Outcome: Progressing   09/25/16 0931   Goal/Interventions addressed this shift   Gastrointestinal/Urinary management Adequate bladder emptying per phase of care     Goal: Uterine management  Outcome: Progressing   09/25/16 0931   Goal/Interventions addressed this shift   Uterine management Assess fundus and notify LIP if not firm, midline, or at or below the umbilicus, or if abdomen is abnormally distended;Assess for hemorrhage risk using appropriate screening tool     Goal: Perineum will be clean, dry, and intact and without discharge or hematoma  Outcome: Progressing   09/25/16 0931   Goal/Interventions addressed this shift   Perineum will be clean, dry, and intact and without discharge or hematoma Place cold pack on perineum;Provide pericare     Goal: VTE Prevention  Outcome: Progressing   09/25/16 0931   Goal/Interventions addressed this shift   VTE prevention Patient ambulating with assistance     Goal: Evidence of positive mother-baby interactions  Outcome: Progressing   09/25/16 0931   Goal/Interventions addressed this shift   Evidence of positive mother-baby interactions Include patient/patient care companion in  decisions related to care;Initiate skin to skin;Initiate safety and falls prevention interventions;Assess emotional status and coping mechanisms

## 2016-09-25 NOTE — Progress Notes (Incomplete)
Rhonda [name]  1990/10/05  25 y.o.     Delivery Date/Time: 09/25/2016  4:14 AM        Delivery Type:  Vaginal, Spontaneous Delivery             Laceration Type:     1st;Labial   Episiotomy:     None      GP: [id number] EBL: : 100 , 0    OB:    Dionicia Abler, MD  No Known Allergies    PRENATAL  Lab Results   Component Value Date    ABORH A POS 09/24/2016    GBS Negative 08/26/2016    HEPBSAG Negative 04/16/2016    RUBELLAABIGG Immune 04/16/2016    RPR Nonreactive 04/16/2016    CTRACHOMATCX Negative 06/12/2016       Past Medical History:   Diagnosis Date   . Anemia    . Chlamydia 04/16/2016    treated; tested negative 06/12/16   . Hypertension     chronic but not on meds and gestational with first pregnancy     Past Surgical History:   Procedure Laterality Date   . ENDOSCOPY UPPER GI     . WISDOM TOOTH EXTRACTION          Vaccine Screening:           Tdap:       Wants    Given    Declined   Had      Rhogam:   Needs   Given   Ordered []    MMR:    Needs    Given    Declined       Sch Meds:________________________      Stool Softeners_________________           PRN Meds: _______________________     PAIN REASSESSMENT DUE_______________________    IV:  []    Foley:  []     On Q Pump:  []        Discharge Videos   []      Safety Contract:  []   ________________________________________________________________________    Hospital Peds:    Home Peds:    H&P:  []         Baby Gender: Female   Gestation:  [redacted]w[redacted]d  APGAR: 8  and 9    Birth Weight: 8 lb 4.3 oz (3750 g)   Feeding Type:  Breast milk     Fenton Growth Scale:    Void:  []        Stool:  []        Bath:  []        Hep B:  []      Eye & Thigh:  []        Blood Type: __________     Labs:___________________________________________________       Hear:     []    CID:  []                  TCB:  ______ @_____  hr   CCHD:  []        PKU:    []        Medimap []        Circ:      []     CSC:    []        My Chart Completed   []      LACTATION    Lactation consult  - Ordered / Completed        Previous hx________________  Breast pump initiated  []         Nipple shield  []      P Syringe  []        Breastfeeding class  []     Breast pump rental  []

## 2016-09-25 NOTE — Progress Notes (Addendum)
[id number]. Patient sitting for epidural. Unable to continuously monitor FHR while in this position. Will readjusted efm.

## 2016-09-25 NOTE — Lactation Note (Signed)
09/25/16 1145   Lactation Consultation   Visit Type  Initial Assessment   Infant Assessment   Activity Sleeping   Feeding Assessment   Infant Level of Arousal Eyes do not open to stimulus;Infant placed skin to skin;Encouraged skin to skin   Feeding Positions Used Cross Cradle   Alignment Straight line at breast level;Nose to nipple;Chin touching breast   Areolar Grasp (no latch)   OTHER   Oral Assessment (WDL) (UTA)   Feeding Information   Feeding Tolerance Disinterested        09/25/16 1145   Lactation Consultation   Visit Type  Initial Assessment   Maternal Information   Breastfeeding Status Yes   Previous Breastfeeding Experience (pumped for 4 months with first child, unable to latch)   Taking meds incompatible with breastfeeding N   Storing milk  N   Pumping Experience Y   Pumping this hospitalization N   Dumping Milk N   Breast Assessment   Right Breast Medium   Right Nipple Flat   Equipment   Equipment Personal use pump     Visit to pt room per RN request. Pt was not able to nurse first child and pumped exclusively. New baby latched well this am but has been sleepy this AM. Attempted to latch baby in Right [facility], but baby did not latch after initial mouthing of breast, fell asleep. Mom able to easily express colostrum with minimal hand expression on right breast.  Placed baby in skin to skin and encouraged pt to call when baby shows feeding cues. Feeding cues explained.  Normal nursing patterns of 8-12X/24 hours discussed.    Mom concerned because of flat nipples.   Plan is to consider pumping if baby doesn't nurse well or if would like to pump to see if it draws out nipple.  Not interested in pumping at this time.    May consider Nipple shield in future if baby doesn't latch well. Didn't use/try NS with last baby.    Pt has PUP at home from last child with new pump parts. Encouraged to call pump company to check for recalls and call insurance to see if they will provide a new pump.    Encouraged to ask for  pump/assistance with latch when baby shows feeding cues. LC number placed on whiteboard.

## 2016-09-25 NOTE — Progress Notes (Signed)
Patient now 8/100/-1 feeling rectal pressure/pain. Anesthesia paged for delivery bolus. Dr. Alan Mulder updated and in unit.

## 2016-09-25 NOTE — Lactation Note (Signed)
LC visit in room, family visitors.  Mother will call at next breast feeding session, given contact information.

## 2016-09-25 NOTE — Progress Notes (Signed)
Patient now 9.5/+2 and feeling strong urge to push. MD aware and en route to room.

## 2016-09-25 NOTE — Progress Notes (Signed)
SROM clear fluid. Cervidil removed (was basically falling out). Bed pads changed; patient repositioned. Patient denies pain at this time. Will continue to monitor.

## 2016-09-25 NOTE — Anesthesia Procedure Notes (Signed)
Epidural    Patient location during procedure: L&D  Reason for block: Labor or C-section  Block at Surgeon's request: Yes    Start time: 09/25/2016 12:33 AM  End time: 09/25/2016 12:51 AM    Staffing  Anesthesiologist: Pandora Leiter, MI [facility]  Resident/CRNA: Lattie Haw  Performed: resident/CRNA     Pre-procedure Checklist   Completed: patient identified, pre-op evaluation, timeout performed and risks and benefits discussed  Timeout Completed:  09/25/2016 12:33 AM    Epidural  Patient monitoring: Pulse oximetry and NIBP    Premedication: Meaningful Contact Maintained  Patient position: sitting    Skin Local: other    Attempts  Number of attempts: 1                      Approach: midline    Needle type: Touhy needle   Needle gauge: 17  Injection technique: LOR saline  Epidural Space ID: 4 cm  CSF Return: No   Blood Return: No  Paresthesia Pain: No    Needle Placement  Needle type: Touhy needle   Needle gauge: 17  Injection technique: LOR saline  CSF Return: No  Blood Return: No          Paresthesia Pain: No    Catheter Placement   Catheter type: side hole  Catheter size: 19 G  Catheter at skin depth: 8 cm  CSF Return: No  Blood Return: No  Test Dose:1.5 % lidocaine and negative    Incremental injection: yes    No Catheter IV/SA Signs or Symptoms    Assessment   Sensory level: T10  Patient tolerated procedure well: Yes  Block Outcome: no complications, successful block and pain improved

## 2016-09-26 NOTE — Lactation Note (Signed)
09/26/16 1355   Lactation Consultation   Visit Type  Follow Up   Infant Assessment   Activity Sleeping;Quiet alert   Feeding Assessment   Infant Level of Arousal Infant placed skin to skin;Encouraged skin to skin   Feeding Positions Used [employer];Football   Alignment Straight line at breast level;Nose to nipple;Chin touching breast   Areolar Grasp Shallow latch   Suck/Swallow (no suckles; mouths and shallow latches)   OTHER   Oral Assessment (WDL) (noted slight indentation at tip of tongue)   Feeding Information   Feeding Tolerance (latches and then sleeps or refuses to suck)        09/26/16 1355   Lactation Consultation   Visit Type  Follow Up   Infant Assessment   Activity Sleeping;Quiet alert   Feeding Assessment   Infant Level of Arousal Infant placed skin to skin;Encouraged skin to skin   Feeding Positions Used [employer];Football   Alignment Straight line at breast level;Nose to nipple;Chin touching breast   Areolar Grasp Shallow latch   Suck/Swallow (no suckles; mouths and shallow latches)   OTHER   Oral Assessment (WDL) (noted slight indentation at tip of tongue)   Feeding Information   Feeding Tolerance (latches and then sleeps or refuses to suck)     Visit to pt room for latch assist after pt called LC.    Assisted to latch on left side with [facility] and [facility] with and without shield. Baby mouthed and latched onto shield, but no long jaw movements noted. Bites shield and clamps, no suckles.    Encouraged skin to skin and will set up EBP soon.    Discussed output/stools with pt and RN updated.

## 2016-09-26 NOTE — Progress Notes (Signed)
Patient c/o of legs and vaginal area being swollen. Discussed with patient to drink lots of fluids, elevate legs up, ambulate often, and use restroom and patient expresses understanding. Patient denies pain at lower extremities at this time.

## 2016-09-26 NOTE — Lactation Note (Signed)
09/26/16 1445   Lactation Consultation   Visit Type  Follow Up   Infant Assessment   Activity Active with stimulation   Feeding Assessment   Infant Level of Arousal Encouraged skin to skin;Initiate pumping   Feeding Positions Used [employer] line at breast level;Nose to nipple;Chin touching breast   Areolar Grasp Shallow latch;Asymmetric latch   Areolar Compression Rhythmic;Long jaw movements   Suck/Swallow With encouragement  (with breast compression on right side, no NS)   Feeding Information   Feeding Tolerance Tolerates well        09/26/16 1445   Lactation Consultation   Visit Type  Follow Up   Breast Assessment   Right Breast Medium   Right Nipple Short;Evert;Creased   Equipment   Equipment Hospital grade pump       Pt nursing baby on right side in [facility] on entry to room. Rhythmic suckles noted.    After baby had a 14 min feed on right side, she came off breast, lips wet, nipple creased.    Reviewed pump set up, use, care of parts and flange fit, verbally and hand-out given. Encouraged to pump 8x/24 hours for 15 mins if baby not going to breast.     Mom pumped comfortably at 25 % suction and was able to collect about 2.59ml expressed milk by p-syringe to give back to baby.     LC verbally and demonstrated/explained steps of finger feeding: washing hands placing baby in upright position, placing clean (gloved) finger pad side up to baby's lips, gentle tapping, waiting for baby to open, inserting finger to upper palate, waiting for suckling and introducing p-syringe to side of finger with dominant hand and giving small amounts when baby suckles. Instructed not to give the expressed milk if baby not suckling or is asleep.     Father demonstrated understanding and FF small amount of expressed by p-syringe to baby in upright position in bassinet. Taught to look for cues and stop if baby needs a break (splayed fingers, leaking milk, loud swallows, etc).    P-syringe handout given, NS care  plan and handout discussed and reviewed.    Consider using NS on left and attempting latch on right without NS.     Output requirements discussed.    Follow up planned for AM.    Plan is to offer breast 8-12X/24 hours watch feeding cues, skin to skin to wake baby, if baby not nursing well, finger feed pumped milk.

## 2016-09-26 NOTE — Plan of Care (Signed)
Problem: Vaginal/Cesarean Delivery  Goal: Postpartum management of pain/discomfort  Outcome: Progressing    Goal: Breasts are soft with nipple integrity intact  Outcome: Progressing   09/26/16 1453   Goal/Interventions addressed this shift   Breasts are soft with nipple integrity intact Perform breast/nipple assessment;Assess and manage engorgement;Breastfeed and/or pump breasts at least 8-12 times within 24 hours;Ensure proper positioning and latch;Provide pharmacologic and non-pharmacologic interventions as needed     Goal: Gastrointestinal/Urinary management  Outcome: Progressing   09/26/16 1453   Goal/Interventions addressed this shift   Gastrointestinal/Urinary management Use CAUTI prevention techniques;Maintain urine output of 30/mL per hour or greater;Monitor intake and output per orders;Adequate bladder emptying per phase of care;Offer pharmacologic and non-pharmacologic GI management interventions     Goal: Uterine management  Outcome: Progressing   09/26/16 1453   Goal/Interventions addressed this shift   Uterine management Assess fundus and notify LIP if not firm, midline, or at or below the umbilicus, or if abdomen is abnormally distended;Assess for hemorrhage risk using appropriate screening tool     Goal: Perineum will be clean, dry, and intact and without discharge or hematoma  Outcome: Progressing   09/26/16 1453   Goal/Interventions addressed this shift   Perineum will be clean, dry, and intact and without discharge or hematoma Provide pericare;Sitz bath PRN as ordered by LIP;Monitor wound/incision for signs of infections;Assess for hemorrhoids and provide interventions     Goal: VTE Prevention  Outcome: Progressing   09/26/16 1453   Goal/Interventions addressed this shift   VTE prevention Administer anticoagulant(s) and/or apply anti-embolism stockings/devices as ordered;Patient ambulating independently;Assess skin color, turgor, peripheral pulses, perfusion, and presence of edema, e.g. capillary  refill     Goal: Evidence of positive mother-baby interactions  Outcome: Progressing   09/26/16 1453   Goal/Interventions addressed this shift   Evidence of positive mother-baby interactions Include patient/patient care companion in decisions related to care;Initiate skin to skin;Initiate safety and falls prevention interventions;Assess emotional status and coping mechanisms;Encourage rooming in and infant feeding on demand;Ensure parent/caregiver provides infant care;Assess parent/caregiver engagement and awareness of infant cues/behavior

## 2016-09-26 NOTE — Progress Notes (Signed)
Postpartum Note PPD 1    Subjective:  Delivery Type: Vaginal   No complaints.  Minimal Bleeding.  Breastfeeding well.    Objective:  Vital Signs:  Temp:  [97.6 F (36.4 C)-98.2 F (36.8 C)] 97.9 F (36.6 C)  Heart Rate:  [63-75] 63  Resp Rate:  [17-20] 18  BP: (110-124)/(60-88) 124/88    Fundus: firm, non tender  Lochia: mod, rubra.    Recent Labs:    Recent Labs  Lab 09/24/16  1940   WBC 8.92   Hgb 10.6*   Hematocrit 32.1*   Platelets 197       Assessment/Plan:  Stable - Postpartum Day 1  Routine care, discharge  tomorrow, F/U in 6 weeks.

## 2016-09-26 NOTE — Plan of Care (Signed)
Problem: Safety  Goal: Patient will be free from injury during hospitalization  Outcome: Progressing   09/26/16 0133   Goal/Interventions addressed this shift   Patient will be free from injury during hospitalization  Assess patient's risk for falls and implement fall prevention plan of care per policy;Provide and maintain safe environment;Ensure appropriate safety devices are available at the bedside;Include patient/ family/ care giver in decisions related to safety;Hourly rounding;Assess for patients risk for elopement and implement Elopement Risk Plan per policy   Low Risk for Falls at present    Problem: Vaginal/Cesarean Delivery  Goal: Maternal Status within defined parameters  Outcome: Progressing   09/26/16 0133   Goal/Interventions addressed this shift   Maternal status with defined parameters  Monitor/assess vital signs;Perform physical assessment per phase of care     Goal: Postpartum management of pain/discomfort  Outcome: Progressing   09/26/16 0133   Goal/Interventions addressed this shift   Postpartum management of pain/discomfort Assess pain using a consistent, developmental/age appropriate pain scale;Assess pain level before and following intervention;Include patient/patient care companion in decisions related to pain management;Offer non-pharmacologic pain management interventions     Goal: Breasts are soft with nipple integrity intact  Outcome: Progressing   09/26/16 0133   Goal/Interventions addressed this shift   Breasts are soft with nipple integrity intact Perform breast/nipple assessment;Assess and manage engorgement;Breastfeed and/or pump breasts at least 8-12 times within 24 hours;Ensure proper positioning and latch;Provide pharmacologic and non-pharmacologic interventions as needed     Goal: Gastrointestinal/Urinary management  Outcome: Progressing   09/26/16 0133   Goal/Interventions addressed this shift   Gastrointestinal/Urinary management Adequate bladder emptying per phase of  care;Offer pharmacologic and non-pharmacologic GI management interventions     Goal: Uterine management  Outcome: Progressing   09/26/16 0133   Goal/Interventions addressed this shift   Uterine management Assess fundus and notify LIP if not firm, midline, or at or below the umbilicus, or if abdomen is abnormally distended;Assess for hemorrhage risk using appropriate screening tool     Goal: Perineum will be clean, dry, and intact and without discharge or hematoma  Outcome: Progressing   09/26/16 0133   Goal/Interventions addressed this shift   Perineum will be clean, dry, and intact and without discharge or hematoma Place cold pack on perineum;Provide pericare;Monitor wound/incision for signs of infections;Assess for hemorrhoids and provide interventions     Goal: VTE Prevention  Outcome: Progressing   09/26/16 0133   Goal/Interventions addressed this shift   VTE prevention Patient ambulating independently;Assess skin color, turgor, peripheral pulses, perfusion, and presence of edema, e.g. capillary refill     Goal: Evidence of positive mother-baby interactions  Outcome: Progressing   09/26/16 0133   Goal/Interventions addressed this shift   Evidence of positive mother-baby interactions Include patient/patient care companion in decisions related to care;Initiate skin to skin;Initiate safety and falls prevention interventions;Assess emotional status and coping mechanisms;Encourage rooming in and infant feeding on demand;Ensure parent/caregiver provides infant care;Assess parent/caregiver engagement and awareness of infant cues/behavior       Problem: Moderate/High Fall Risk Score >5  Goal: Patient will remain free of falls  Outcome: Completed Date Met: 09/26/16

## 2016-09-27 MED ORDER — HYDROCORTISONE 1 % EX OINT
TOPICAL_OINTMENT | Freq: Three times a day (TID) | CUTANEOUS | Status: DC | PRN
Start: 2016-09-27 — End: 2019-02-21

## 2016-09-27 MED ORDER — WITCH HAZEL-GLYCERIN EX PADS
1.0000 | MEDICATED_PAD | CUTANEOUS | Status: DC | PRN
Start: 2016-09-27 — End: 2019-02-21

## 2016-09-27 MED ORDER — IBUPROFEN 600 MG PO TABS
600.0000 mg | ORAL_TABLET | Freq: Four times a day (QID) | ORAL | Status: DC | PRN
Start: 2016-09-27 — End: 2019-02-21

## 2016-09-27 MED ORDER — LANOLIN EX OINT
TOPICAL_OINTMENT | CUTANEOUS | Status: DC | PRN
Start: 2016-09-27 — End: 2019-02-21

## 2016-09-27 MED ORDER — DSS 100 MG PO CAPS
200.0000 mg | ORAL_CAPSULE | Freq: Two times a day (BID) | ORAL | Status: DC | PRN
Start: 2016-09-27 — End: 2019-07-19

## 2016-09-27 NOTE — Discharge Summary -  Nursing (Signed)
Discharge instructions and paperwork given. No questions at this time. Pt left @ 1115 via walking with baby in carseat.

## 2016-09-27 NOTE — Discharge Instructions (Signed)
After Your Delivery Discharge Instructions    After Discharge Orders:  Call to be seen in the office in 6 wks    Current Discharge Medication List      START taking these medications    Details   docusate sodium (COLACE) 100 MG capsule Take 2 capsules (200 mg total) by mouth 2 (two) times daily as needed for Constipation.      hydrocortisone 1 % ointment Apply topically 3 (three) times daily as needed (perineal care).      ibuprofen (ADVIL,MOTRIN) 600 MG tablet Take 1 tablet (600 mg total) by mouth every 6 (six) hours as needed for Pain or Fever.      lanolin ointment Apply topically every 2 (two) hours as needed for Dry Skin (nipple discomfort).      witch hazel-glycerin (TUCKS) pad Apply 1 each topically as needed for Pain.         CONTINUE these medications which have NOT CHANGED    Details   Prenatal Vit-Fe Fumarate-FA (PRENATAL PO) Take by mouth.            Medical equipment: Breast pump        May havetub baths in plain warm water   You may use warm compresses on incision site     After your delivery - signs and symptoms to watch for:   Fever - Oral temperature greater than 100.4 degrees Fahrenheit   Foul-smelling vaginal discharge   Headache unrelieved by "pain meds"   Difficulty urinating   Breasts reddened, hard, hot to the touch   Nipple discharge which is foul-smelling or contains pus   Increased pain at the site of the laceration   Difficulty breathing with or without chest pain   New calf pain especially if only on one side   Sudden, continuing increased vaginal bleeding with or without clots   Unrelieved feelings of:   Inability to cope   Sadness   Anxiety   Lack of interest in baby   Insomnia   Crying     What to do at home:   See patient education handouts for full information   Resume activity gradually    Don't lift anything heavier than baby and carrier until OK'd by your Physician or Midwife   No sex until OK'd by your Physician or Midwife   Take care of yourself by  sleeping/resting as much as possible   Eat regular nutritious meals   Let someone else care for you, your baby, and housework as much as possible    Take pain medication as prescribed whenever you need them   Wear compression stockings if prescribed    To avoid/relieve constipation take stool softeners if advised    Drink lots of water/fruit juices   Increase fiber in your diet   Breast care: Wear support bra 24/7; use lanolin ointment/cream as needed     Refer to Newborn Discharge Instructions for problems or follow-up regarding  feeding or nursing

## 2016-09-27 NOTE — Progress Notes (Signed)
[facility] POSTPARTUM ROUNDING NOTE    Subjective:  Delivery Type: Vaginal   Minimal Bleeding, breastfeeding well.    Objective:  Vital Signs Stable; afebrile; breast soft, non tender; fundus firm, non tender;       Assessment/Plan:  Stable - Postpartum Day 2  Routine care, discharge today(s),   F/U in 6 weeks   Prescriptions given: Motrin and contiue prenatal vit

## 2016-09-27 NOTE — Discharge Summary (Signed)
Obstetrical Discharge Form      Admission Diagnosis: Intrauterine Pregnancy at [redacted]w[redacted]d    Antepartum complications: none    Diagnoses: Active Problems:    Pregnancy with 39 completed weeks gestation      Date of Delivery: 09/25/2016    Time of Delivery: 4:14 AM      Delivered By: Ovidio Kin C     Delivery Type: spontaneous vaginal  Tubal Ligation: n/a    Anesthesia: Epidural anesthesia    Baby: Liveborn Female ; Birth weight: 8 lb 4.3 oz (3750 g) ; Apgar 1 minute: 8  Apgar 5 minute: 9 ;                                                       Feeding Type: Breast milk     Delivery complications: precipitous delivery    Episiotomy: None   Laceration Type:  1st;Labial     Placenta: ,         ,         Cord:      Hospital Course : Rhonda [name] is a 26 y.o. with an IUP at [redacted]w[redacted]d. She was admitted to the hospital for induction of labor. Her antepartum course was uncomplicated.  Patient delivered via spontaneous vaginal delivery. Her postpartum course was uncomplicated. She was discharged home with infant in a stable good condition.    Discharge Date: 09/27/16     Plan:     Follow-up appointment in 6 weeks

## 2016-09-27 NOTE — Plan of Care (Signed)
Problem: Safety  Goal: Patient will be free from injury during hospitalization  Outcome: Progressing   09/26/16 0133   Goal/Interventions addressed this shift   Patient will be free from injury during hospitalization  Assess patient's risk for falls and implement fall prevention plan of care per policy;Provide and maintain safe environment;Ensure appropriate safety devices are available at the bedside;Include patient/ family/ care giver in decisions related to safety;Hourly rounding;Assess for patients risk for elopement and implement Elopement Risk Plan per policy       Problem: Vaginal/Cesarean Delivery  Goal: Maternal Status within defined parameters  Outcome: Progressing   09/26/16 0133   Goal/Interventions addressed this shift   Maternal status with defined parameters  Monitor/assess vital signs;Perform physical assessment per phase of care     Goal: Postpartum management of pain/discomfort  Outcome: Progressing   09/27/16 0051   Goal/Interventions addressed this shift   Postpartum management of pain/discomfort Assess pain using a consistent, developmental/age appropriate pain scale;Assess pain level before and following intervention;Include patient/patient care companion in decisions related to pain management;Offer non-pharmacologic pain management interventions     Goal: Breasts are soft with nipple integrity intact  Outcome: Progressing   09/27/16 0051   Goal/Interventions addressed this shift   Breasts are soft with nipple integrity intact Perform breast/nipple assessment;Assess and manage engorgement;Breastfeed and/or pump breasts at least 8-12 times within 24 hours;Ensure proper positioning and latch;Provide pharmacologic and non-pharmacologic interventions as needed     Goal: Gastrointestinal/Urinary management  Outcome: Progressing   09/27/16 0051   Goal/Interventions addressed this shift   Gastrointestinal/Urinary management Adequate bladder emptying per phase of care;Offer pharmacologic and  non-pharmacologic GI management interventions     Goal: Uterine management  Outcome: Progressing   09/27/16 0051   Goal/Interventions addressed this shift   Uterine management Assess fundus and notify LIP if not firm, midline, or at or below the umbilicus, or if abdomen is abnormally distended;Assess for hemorrhage risk using appropriate screening tool     Goal: Perineum will be clean, dry, and intact and without discharge or hematoma  Outcome: Progressing   09/27/16 0051   Goal/Interventions addressed this shift   Perineum will be clean, dry, and intact and without discharge or hematoma Provide pericare;Sitz bath PRN as ordered by LIP;Monitor wound/incision for signs of infections;Assess for hemorrhoids and provide interventions     Goal: VTE Prevention  Outcome: Progressing   09/27/16 0051   Goal/Interventions addressed this shift   VTE prevention Patient ambulating independently;Assess skin color, turgor, peripheral pulses, perfusion, and presence of edema, e.g. capillary refill     Goal: Evidence of positive mother-baby interactions  Outcome: Progressing   09/27/16 0051   Goal/Interventions addressed this shift   Evidence of positive mother-baby interactions Include patient/patient care companion in decisions related to care;Initiate skin to skin;Initiate safety and falls prevention interventions;Assess emotional status and coping mechanisms;Encourage rooming in and infant feeding on demand;Ensure parent/caregiver provides infant care;Assess parent/caregiver engagement and awareness of infant cues/behavior

## 2016-09-27 NOTE — Discharge Instr - AVS First Page (Signed)
Reason for your Hospital Admission:  Term pregnancy, labor induction      Instructions for after your discharge:  After a Vaginal Birth  After having a baby, your body may be very tired. It can take time to recover from a vaginal delivery. You may stay in the hospital or birth center from 1 to 4 days.In some cases, you may be able to go home the same day.    Right after the delivery  Your temperature and blood pressure will be taken until they are stable. A nurse or other healthcare provider will observe you as you rest. You may have afterbirth pains. These are cramps caused by the uterus shrinking. Sanitary pads are used to soak up the discharge of the uterine lining. To make sure that you aren't bleeding too much, the pad will be checked. And the firmness of your uterus will be checked. To do this, a nurse will gently push down on your stomach. If you had anesthesia, you'll be watched closely until you can feel and move your toes. If you have perineal pain (pain between the vagina and anus), an ice pack can help.  Newborn care  While still in the hospital or birth center, you'll learn how to hold and feed your baby. You willalso be given instructions on how to care for your baby. This includes bathing and feeding.  Preparing to go home  You may be anxious to go home as soon as possible. Before you and your baby go home, a healthcare provider will check to be sure you are healthy enough to take care of your baby and yourself. You're ready to go home when:   You can walk to the bathroom and use the bathroom without help.   You can eat solid food and swallow pills (if needed).   You have no sign of infection or other health problems, including fever.   You have adequate pain control.   Your bleeding isn't excessive.   You are able to care for your newborn and are emotionally stable.  Before leaving the hospital or birth center, you'll be given written instructions for home self-care after vaginal  delivery. Be sure to follow these instructions carefully. If you have questions or concerns, talk about them now.  If you have stitches  You may have received stitches in the skin near your vagina. The stitches might have closed an episiotomy (an incision that enlarges the opening of the vagina). Or you may have needed stitches to repair torn skin. Either way, your stitches should dissolve within weeks. Until then, you can help reduce discomfort, aid healing, and reduce your risk of infection by keeping the stitches clean. These tips can help:   Gently wipe from front to back after you urinate or have a bowel movement.   After wiping, spray warm water on the area. Or you can have a sitz bath. This means sitting in a tub with a few inches of water in it. Then pat the area dry or use a hairdryer on a cool setting.   Do not use soap or any solution except water on the area.   You can take a shower unless told not to.   Change sanitary pads at least every 2 to 4 hours.   Place cold or heat packs on the area as directed by your healthcare providers or nurses. Keep a thin towel between the pack and your skin.   Sit on firm seats so the stitches pull less.    Postnatal follow-up  Schedule a postnatal follow-up exam with your healthcare provider for about 6 weeks after delivery. During this exam, your uterus and vaginal area will be checked. Contact your healthcare provider if you think you or your baby are having any problems.  When to call your healthcare provider  Call your health care provider right away if you have:   A fever of 100.4F (38.0C) or higher   Bleeding that requires a new sanitary pad after an hour, or large blood clots   Pain in your vagina that gets worse and isn't relieved with medicine   Swelling, discharge, or increased pain from vaginal tear or episiotomy   Burning, pain, red streaks, or lumpy areas in your breasts that may be accompanied by flu-like symptoms   Cracks, blisters, or blood  on your nipples   Burning or pain when you urinate   Nausea or vomiting   Dizziness or fainting   Feelings of extreme sadness or anxiety, or a feeling that you don't want to be with your baby   Abdominal pain that isn't relieved with medicine   Vaginal discharge that has a bad odor   No bowel movement for 5 days   Painful urination, orinability to control urination   Redness, warmth, or pain in the lower leg   Chest pain   Date Last Reviewed: 10/22/2013   2000-2016 [employer] [address], [place] [address]. All rights reserved. This information is not intended as a substitute for professional medical care. Always follow your healthcare professional's instructions.

## 2016-09-27 NOTE — Lactation Note (Signed)
Mother requests, receives consult with lactation consultant prior to her discharge to home later this morning;  Mother reports that infant is gradually improving in her ability to effectively latch and suckle @ breast; Mother with freely flowing transition breastmilk - obtained > 1 oz of milk within 10 min of pumping breasts;    Discussed importance of infant latching deeply onto breast to avoid increasingly sore nipple tissue;     Discussed possible 'oversupply' and 'overactive letdown' feeding issues - recommended that mother breastfeed only one breast per feeding or at least one breast for at least 30 min before switching to second side to prevent infant obtaining large quantities of foremilk and not suckling long enough on the same breast to transfer the richer hind milk which helps infants digestion and sleep patterns.    Reviewed signs and symptoms of mastitis vs engorgement.    Encouraged mother to obtain several weight checks in next 1-2 weeks to verify infant's ability to transfer milk well at the breast;

## 2019-02-20 ENCOUNTER — Encounter (INDEPENDENT_AMBULATORY_CARE_PROVIDER_SITE_OTHER): Payer: Self-pay

## 2019-02-21 ENCOUNTER — Ambulatory Visit (INDEPENDENT_AMBULATORY_CARE_PROVIDER_SITE_OTHER): Payer: BLUE CROSS/BLUE SHIELD | Admitting: Obstetrics & Gynecology

## 2019-02-21 ENCOUNTER — Encounter (INDEPENDENT_AMBULATORY_CARE_PROVIDER_SITE_OTHER): Payer: Self-pay | Admitting: Obstetrics & Gynecology

## 2019-02-21 VITALS — BP 126/82 | Ht 66.0 in | Wt 141.0 lb

## 2019-02-21 DIAGNOSIS — N912 Amenorrhea, unspecified: Secondary | ICD-10-CM

## 2019-02-21 DIAGNOSIS — Z113 Encounter for screening for infections with a predominantly sexual mode of transmission: Secondary | ICD-10-CM

## 2019-02-21 DIAGNOSIS — Z01419 Encounter for gynecological examination (general) (routine) without abnormal findings: Secondary | ICD-10-CM

## 2019-02-21 DIAGNOSIS — R102 Pelvic and perineal pain: Secondary | ICD-10-CM

## 2019-02-21 NOTE — Progress Notes (Signed)
Subjective:       Rhonda [name] is a 28 y.o. female, G2P2002, new to our practice.  She is here for a routine exam.  Pt reporting pelvic pain and back pain that both occurred after last delivery in 2018. Feels having her epidural may have contributed, however back pain is worst during menses. Reporting new onset bloating as well. Denies hx of fibroids. Cycles last around 33-35 days.  Personal health questionnaire reviewed: Yes. Pt reports hx of chlamydia in 2018; will check STI screening vaginal cultures today.      Gynecologic History  Patient's last menstrual period was 01/28/2019.  Contraception: condoms  Breast self exam: discussed         Obstetric History  OB History   Gravida Para Term Preterm AB Living   2 2 2  0 0 2   SAB TAB Ectopic Multiple Live Births   0 0 0 0 2      # Outcome Date GA Lbr Len/2nd Weight Sex Delivery Anes PTL Lv   2 Term 09/25/16 [redacted]w[redacted]d / 00:12 8 lb 4.3 oz (3.75 kg) F Vag-Spont EPI N LIV   1 Term 10/03/13    F Vag-Spont EPI  LIV       The following portions of the patient's history were reviewed and updated as appropriate: allergies, current medications, past family history, past medical history, past social history, past surgical history and problem list.    Review of Systems  Pertinent items are noted in HPI.      Objective:       @BP  126/82    Ht 5\' 6"  (1.676 m)    Wt 141 lb (64 kg)    LMP 01/28/2019    BMI 22.76 kg/m   General appearance: alert, appears stated age and cooperative  Neck: no adenopathy, supple, symmetrical, trachea midline and thyroid not enlarged, symmetric, no tenderness/mass/nodules  Breasts: normal appearance, no masses or tenderness, No axillary or supraclavicular adenopathy  Abdomen: soft, non-tender; bowel sounds normal; no masses,  no organomegaly  Pelvic exam:     Urinary system: urethral meatus normal   External genitalia: normal general appearance   Vaginal: normal mucosa without prolapse or lesions and normal rugae   Cervix: normal appearance and thin  prep PAP obtained   Adnexa: normal bimanual exam   Uterus: normal single, nontender and anteverted   Rectal: deferred          Assessment:       28 y.o. [id number] Healthy female exam.      Plan:     - Patient was welcomed to our practice.   - Pap :   Done today; I will contact the patient with any abnormalities   - GC/CT, trichomonas included with Pap today  - Contraception:    Condoms; patient was counseled in the high failure rate of this method (nearly 18% per year) and counseled on other options, including IUD & OCPs  - Discussed if does want to use OCPs to treat pain, absolutely cannot smoke tobacco (currently reports 1 cigarette per day)  - pelvic sono ordered; pt to return in 1-2 months discuss results  - recommend miralax as is having hard stools  - Likely plan for referral to GI for bloating pending normal pelvic sono  - Mammogram:  Will start at age 72 per guidelines    - urine pregnancy test in office negative today; discussed with patient may be too early to detect positive pregnancy  - Pt will  return to the office in 1 year for annual exam and knows to call in the interim with any questions or concerns.

## 2019-02-21 NOTE — Addendum Note (Signed)
Addended by: Diamond Nickel D. on: 02/21/2019 05:18 PM     Modules accepted: Orders

## 2019-02-22 ENCOUNTER — Encounter (INDEPENDENT_AMBULATORY_CARE_PROVIDER_SITE_OTHER): Payer: Self-pay

## 2019-02-22 LAB — POCT UA URISTIX: Glucose, UA POCT: NEGATIVE mg/dL

## 2019-02-25 LAB — IMAGE GUIDED PAP,CHLAMYDIA TRACHOMATIS,NEISERRIA GONORRHEA, TRICHOMONAS VAGINALIS REFLEX HPV W/ ASCU
.: 0
Chlamydia, Nuc. Acid Amp: NEGATIVE
Gonococcus by Nucleic acid Amp: NEGATIVE
TRICH VAG BY NAA: NEGATIVE

## 2019-02-26 NOTE — Progress Notes (Signed)
Pap is negative. STI screening in Pap negative.

## 2019-03-01 ENCOUNTER — Telehealth (INDEPENDENT_AMBULATORY_CARE_PROVIDER_SITE_OTHER): Payer: Self-pay

## 2019-03-01 NOTE — Telephone Encounter (Signed)
Pt calling requesting results of her recent pap smear. Pt states she has received resutls via MyChart and would like them clarified for her. Told pt pap results are negative and STI screening in pap is negative per Dr. Clarisa Schools.

## 2019-03-24 NOTE — L&D Delivery Note (Signed)
Rhonda [name]  [id number] at [redacted]w[redacted]d    Pre-Delivery Diagnosis: Term pregnancy, Induced labor, Pregnancy complicated by: Anemia.     Date of Delivery: 01/01/2020   Time of Delivery:  12:05 PM      Delivering Physician:   Heather Roberts, CNM    Induction/Augmentation: artificial rupture of membranes and pitocin    Type of Delivery: Spontaneous vaginal delivery    Laceration: vaginal    Episiotomy or Incision: none    Repair: 3-0 chromic SH    Anesthesia: Epidural     Baby A         Gender: female    Position: cephalic    Infant Wt:     8 lb 8.2 oz (3860 g)     Apgars: 8  / 9     Baby B     Amniotic Fluid:  Clear    Placenta and Cord:         Mechanism: spontaneous        Description:  complete, 3 vessel cord    Uterus explored:  No          Additional findings: Infant active and crying at birth, delayed cord clamping, skin to skin and safe passages initiated, fundus firm with scant bleeding. Excellent sphincter tone noted post repair.     Estimated Blood Loss:  100cc             Complications:  none             Heather Roberts, CNM     Pt desires circumcision

## 2019-04-07 ENCOUNTER — Ambulatory Visit (INDEPENDENT_AMBULATORY_CARE_PROVIDER_SITE_OTHER): Payer: BLUE CROSS/BLUE SHIELD | Admitting: Obstetrics & Gynecology

## 2019-05-02 ENCOUNTER — Encounter (INDEPENDENT_AMBULATORY_CARE_PROVIDER_SITE_OTHER): Payer: Self-pay | Admitting: Advanced Practice Midwife

## 2019-05-02 ENCOUNTER — Telehealth (INDEPENDENT_AMBULATORY_CARE_PROVIDER_SITE_OTHER): Payer: Self-pay

## 2019-05-02 DIAGNOSIS — N912 Amenorrhea, unspecified: Secondary | ICD-10-CM

## 2019-05-02 NOTE — Telephone Encounter (Signed)
Pt scheduled her COP sono at [facility] for 3/1. Order placed and faxed to [facility]Ultrasound center.

## 2019-05-11 ENCOUNTER — Emergency Department: Payer: BLUE CROSS/BLUE SHIELD

## 2019-05-11 ENCOUNTER — Emergency Department
Admission: EM | Admit: 2019-05-11 | Discharge: 2019-05-12 | Disposition: A | Discharge status: Home / Self Care | Payer: BLUE CROSS/BLUE SHIELD | Attending: Emergency Medical Services | Admitting: Emergency Medical Services

## 2019-05-11 DIAGNOSIS — O10911 Unspecified pre-existing hypertension complicating pregnancy, first trimester: Secondary | ICD-10-CM | POA: Insufficient documentation

## 2019-05-11 DIAGNOSIS — R079 Chest pain, unspecified: Secondary | ICD-10-CM | POA: Insufficient documentation

## 2019-05-11 DIAGNOSIS — Z3A01 Less than 8 weeks gestation of pregnancy: Secondary | ICD-10-CM | POA: Insufficient documentation

## 2019-05-11 DIAGNOSIS — Z20822 Contact with and (suspected) exposure to covid-19: Secondary | ICD-10-CM | POA: Insufficient documentation

## 2019-05-11 DIAGNOSIS — O99891 Other specified diseases and conditions complicating pregnancy: Secondary | ICD-10-CM | POA: Insufficient documentation

## 2019-05-11 LAB — URINALYSIS REFLEX TO MICROSCOPIC EXAM - REFLEX TO CULTURE
Bilirubin, UA: NEGATIVE
Blood, UA: NEGATIVE
Glucose, UA: NEGATIVE
Ketones UA: 20 — AB
Leukocyte Esterase, UA: NEGATIVE
Nitrite, UA: NEGATIVE
Protein, UR: NEGATIVE
Specific Gravity UA: 1.021 (ref 1.001–1.035)
Urine pH: 7 (ref 5.0–8.0)
Urobilinogen, UA: NORMAL mg/dL (ref 0.2–2.0)

## 2019-05-11 LAB — CBC AND DIFFERENTIAL
Absolute NRBC: 0 10*3/uL (ref 0.00–0.00)
Basophils Absolute Automated: 0.04 10*3/uL (ref 0.00–0.08)
Basophils Automated: 0.4 %
Eosinophils Absolute Automated: 0.1 10*3/uL (ref 0.00–0.44)
Eosinophils Automated: 1.1 %
Hematocrit: 37.4 % (ref 34.7–43.7)
Hgb: 12.7 g/dL (ref 11.4–14.8)
Immature Granulocytes Absolute: 0.02 10*3/uL (ref 0.00–0.07)
Immature Granulocytes: 0.2 %
Lymphocytes Absolute Automated: 3.09 10*3/uL (ref 0.42–3.22)
Lymphocytes Automated: 33.8 %
MCH: 29.5 pg (ref 25.1–33.5)
MCHC: 34 g/dL (ref 31.5–35.8)
MCV: 86.8 fL (ref 78.0–96.0)
MPV: 8.8 fL — ABNORMAL LOW (ref 8.9–12.5)
Monocytes Absolute Automated: 0.71 10*3/uL (ref 0.21–0.85)
Monocytes: 7.8 %
Neutrophils Absolute: 5.18 10*3/uL (ref 1.10–6.33)
Neutrophils: 56.7 %
Nucleated RBC: 0 /100 WBC (ref 0.0–0.0)
Platelets: 308 10*3/uL (ref 142–346)
RBC: 4.31 10*6/uL (ref 3.90–5.10)
RDW: 11 % (ref 11–15)
WBC: 9.14 10*3/uL (ref 3.10–9.50)

## 2019-05-11 LAB — COMPREHENSIVE METABOLIC PANEL
ALT: 15 U/L (ref 0–55)
AST (SGOT): 16 U/L (ref 5–34)
Albumin/Globulin Ratio: 1.7 (ref 0.9–2.2)
Albumin: 4.3 g/dL (ref 3.5–5.0)
Alkaline Phosphatase: 56 U/L (ref 37–106)
Anion Gap: 12 (ref 5.0–15.0)
BUN: 12 mg/dL (ref 7–19)
Bilirubin, Total: 0.4 mg/dL (ref 0.2–1.2)
CO2: 20 mEq/L — ABNORMAL LOW (ref 22–29)
Calcium: 9.6 mg/dL (ref 8.5–10.5)
Chloride: 106 mEq/L (ref 100–111)
Creatinine: 1 mg/dL (ref 0.6–1.0)
Globulin: 2.6 g/dL (ref 2.0–3.6)
Glucose: 100 mg/dL (ref 70–100)
Potassium: 3.6 mEq/L (ref 3.5–5.1)
Protein, Total: 6.9 g/dL (ref 6.0–8.3)
Sodium: 138 mEq/L (ref 136–145)

## 2019-05-11 LAB — IHS D-DIMER: D-Dimer: <0.27 ug/mL FEU (ref 0.00–0.50)

## 2019-05-11 LAB — TROPONIN I: Troponin I: <0.01 ng/mL (ref 0.00–0.05)

## 2019-05-11 LAB — LIPASE: Lipase: 33 U/L (ref 8–78)

## 2019-05-11 LAB — GFR: EGFR: >60

## 2019-05-11 LAB — HCG QUANTITATIVE: hCG, Quant.: 11237.4

## 2019-05-11 MED ORDER — SODIUM CHLORIDE 0.9 % IV BOLUS
1000.00 mL | Freq: Once | INTRAVENOUS | Status: AC
Start: 2019-05-11 — End: 2019-05-12
  Administered 2019-05-11: 23:00:00 1000 mL via INTRAVENOUS

## 2019-05-11 NOTE — ED Triage Notes (Signed)
Pt tearful, sharp pain under left rib for past 4 days. Pain getting worse. Referred to ED by friend who is [employer] fundraiser. [redacted] weeks pregnant. Reports anxiety. COVID in January, SOB since then.

## 2019-05-11 NOTE — Discharge Instructions (Signed)
Chest Pain of Unclear Etiology     You have been seen for chest pain. The cause of your pain is not yet known.     Your doctor has learned about your medical history, examined you, and checked any tests that were done. Still, it is not clear why you are having pain. The doctor thinks there is only a small chance that your pain is caused by a health problem that could lead to serious harm or death. Later, your primary care doctor might do more tests or check you again.     Sometimes chest pain is caused by a health problem that can lead to death, like a:  · Heart attack.  · Injury to the large blood vessel in your body (aorta).  · Blood clot in the lung.  · Collapsed lung.      It is not likely that your pain is caused by a health problem that could lead to death if:   · Your chest pain lasts only a few seconds at a time  · You are not short of breath, nauseated (sick to your stomach), sweaty, or lightheaded  · Your pain gets worse when you twist or bend  · Your pain improves with exercise or hard work.     Chest pain is serious. It is very important that you follow up with your regular doctor.     Return here or go to the nearest Emergency Department immediately if:  · Your pain makes you short of breath, nauseated (sick to your stomach), or sweaty.  · Your pain gets worse when you walk, go up stairs, or exert yourself.  · You feel weak, lightheaded, or faint.  · It hurts to breathe.  · Your leg swells.  · Your pain or symptoms get worse   · You have new symptoms or concerns.     If you can't follow up with your doctor, or if at any time you feel you need to be rechecked or seen again, come back here or go to the nearest emergency department.

## 2019-05-11 NOTE — ED Provider Notes (Signed)
History     Chief Complaint   Patient presents with    Chest Pain     29 year old female approximately [redacted] weeks pregnant with reported anxiety and chest pain.  Patient reports she has been having intermittent left-sided lateral chest pain.  Especially at the lower ribs.  Patient reports that she is worried she may have a ectopic pregnancy or pulmonary embolism.  Does not describe lower abdominal pain.  Patient reports sharp intermittent episodes of chest pain.  Patient reports that she was Covid positive approximately 5 weeks ago.  Reports that she had significant coughing and ultimately recovered.  Was never hypoxic or required hospitalization.    History of hypertension although not on any medication.    The history is provided by the patient.   Chest Pain  Pain location:  L lateral chest  Pain severity:  Moderate  Onset quality:  Gradual  Timing:  Intermittent  Chronicity:  New  Relieved by:  Nothing  Worsened by:  Nothing  Associated symptoms: no abdominal pain, no back pain, no cough, no dizziness, no fever and no shortness of breath    Risk factors: hypertension and pregnancy    Risk factors: not obese and no smoking         Past Medical History:   Diagnosis Date    Anemia     Anxiety     Chlamydia 04/16/2016    treated; tested negative 06/12/16    Hypertension     chronic but not on meds and gestational with first pregnancy       Past Surgical History:   Procedure Laterality Date    ENDOSCOPY UPPER GI      WISDOM TOOTH EXTRACTION         Family History   Problem Relation Age of Onset    Cancer Father     Hypertension Father     Stroke Maternal Aunt     Stroke Maternal Uncle     Stroke Paternal Aunt     Stroke Paternal Uncle     Stroke Maternal Grandmother     Cancer Paternal Grandmother     Stroke Paternal Grandfather     Breast cancer Mother     Hypertension Mother     Kidney cancer Mother        Social  Social History     Tobacco Use    Smoking status: Former Smoker     Packs/day:  0.25     Types: Cigarettes    Smokeless tobacco: Never Used   Substance Use Topics    Alcohol use: Yes     Comment: 4-5 WEEKLY    Drug use: Yes     Comment: THC       .     No Known Allergies    Home Medications     Med List Status: In Progress Set By: Justice Britain, RN at 05/11/2019 10:00 PM                      Flagged for Removal             docusate sodium (COLACE) 100 MG capsule     Take 2 capsules (200 mg total) by mouth 2 (two) times daily as needed for Constipation.           Review of Systems   Constitutional: Negative.  Negative for fever.   HENT: Negative.    Eyes: Negative.    Respiratory: Negative.  Negative for cough and shortness of breath.    Cardiovascular: Positive for chest pain.   Gastrointestinal: Negative.  Negative for abdominal pain.   Endocrine: Negative.    Genitourinary: Negative.  Negative for dysuria.   Musculoskeletal: Negative.  Negative for back pain and neck pain.   Skin: Negative for rash.   Neurological: Negative.  Negative for dizziness.   Psychiatric/Behavioral: Negative.  Negative for suicidal ideas.   All other systems reviewed and are negative.      Physical Exam    BP: 155/90, Heart Rate: (!) 121, Temp: 98.5 F (36.9 C), Resp Rate: 20, SpO2: 97 %, Weight: 66.2 kg    Physical Exam  Vitals signs and nursing note reviewed.   Constitutional:       Appearance: She is well-developed.   HENT:      Head: Normocephalic.   Eyes:      Pupils: Pupils are equal, round, and reactive to light.   Neck:      Musculoskeletal: Normal range of motion and neck supple.   Cardiovascular:      Rate and Rhythm: Regular rhythm. Tachycardia present.      Heart sounds: Normal heart sounds. No murmur. No friction rub.      Comments: Rate approximately 120 on exam  Pulmonary:      Effort: Pulmonary effort is normal. No respiratory distress.      Breath sounds: Normal breath sounds. No wheezing or rales.   Chest:      Comments: Chest wall is nontender especially in the left lateral and lower ribs at  the point where patient describes her chest pain  Abdominal:      General: There is no distension.      Palpations: Abdomen is soft.      Tenderness: There is no abdominal tenderness.   Musculoskeletal: Normal range of motion.   Skin:     General: Skin is warm and dry.      Findings: No rash.   Neurological:      Mental Status: She is alert and oriented to person, place, and time.   Psychiatric:         Behavior: Behavior normal.      Comments: While discussing anxiety patient became more anxious and momentarily tearful         EKG: Time of EKG 2210, sinus tachycardia, rate 125, incomplete right bundle, T wave inversion V3  The results of the diagnostic studies below were reviewed by the ED provider:    Labs  Results     Procedure Component Value Units Date/Time    UA Reflex to Micro - Reflex to Culture [[id number]]  (Abnormal) Collected: 05/11/19 2327     Updated: 05/11/19 2336     Urine Type Urine, Clean Ca     Color, UA Yellow     Clarity, UA Cloudy     Specific Gravity UA 1.021     Urine pH 7.0     Leukocyte Esterase, UA Negative     Nitrite, UA Negative     Protein, UR Negative     Glucose, UA Negative     Ketones UA 20     Urobilinogen, UA Normal mg/dL      Bilirubin, UA Negative     Blood, UA Negative    Narrative:      Replace urinary catheter prior to obtaining the urine culture  if it has been in place for greater than or equal to 14  days:->N/A No Foley  Indications for U/A Reflex to Micro - Reflex to  Culture:->Suprapubic Pain/Tenderness or Dysuria    Beta HCG, Quant, Serum [[id number]] Collected: 05/11/19 2227     Updated: 05/11/19 2258     hCG, Sharene Butters. 11,237.4    Narrative:      Replace urinary catheter prior to obtaining the urine culture  if it has been in place for greater than or equal to 14  days:->N/A No Foley  Indications for U/A Reflex to Micro - Reflex to  Culture:->Suprapubic Pain/Tenderness or Dysuria    Troponin I [[id number]] Collected: 05/11/19 2227    Specimen: Blood Updated: 05/11/19 2255      Troponin I <0.01 ng/mL     Narrative:      Replace urinary catheter prior to obtaining the urine culture  if it has been in place for greater than or equal to 14  days:->N/A No Foley  Indications for U/A Reflex to Micro - Reflex to  Culture:->Suprapubic Pain/Tenderness or Dysuria    Lipase [[id number]] Collected: 05/11/19 2227    Specimen: Blood Updated: 05/11/19 2253     Lipase 33 U/L     Narrative:      Replace urinary catheter prior to obtaining the urine culture  if it has been in place for greater than or equal to 14  days:->N/A No Foley  Indications for U/A Reflex to Micro - Reflex to  Culture:->Suprapubic Pain/Tenderness or Dysuria    Comprehensive metabolic panel [[id number]]  (Abnormal) Collected: 05/11/19 2227    Specimen: Blood Updated: 05/11/19 2253     Glucose 100 mg/dL      BUN 12 mg/dL      Creatinine 1.0 mg/dL      Sodium 629 mEq/L      Potassium 3.6 mEq/L      Chloride 106 mEq/L      CO2 20 mEq/L      Calcium 9.6 mg/dL      Protein, Total 6.9 g/dL      Albumin 4.3 g/dL      AST (SGOT) 16 U/L      ALT 15 U/L      Alkaline Phosphatase 56 U/L      Bilirubin, Total 0.4 mg/dL      Globulin 2.6 g/dL      Albumin/Globulin Ratio 1.7     Anion Gap 12.0    Narrative:      Replace urinary catheter prior to obtaining the urine culture  if it has been in place for greater than or equal to 14  days:->N/A No Foley  Indications for U/A Reflex to Micro - Reflex to  Culture:->Suprapubic Pain/Tenderness or Dysuria    GFR [[id number]] Collected: 05/11/19 2227     Updated: 05/11/19 2253     EGFR >60.0    Narrative:      Replace urinary catheter prior to obtaining the urine culture  if it has been in place for greater than or equal to 14  days:->N/A No Foley  Indications for U/A Reflex to Micro - Reflex to  Culture:->Suprapubic Pain/Tenderness or Dysuria    D-Dimer [[id number]] Collected: 05/11/19 2227     Updated: 05/11/19 2246     D-Dimer <0.27 ug/mL FEU     Narrative:      Replace urinary catheter prior to obtaining  the urine culture  if it has been in place for greater than or equal to 14  days:->N/A No Foley  Indications for U/A Reflex to Micro - Reflex to  Culture:->Suprapubic Pain/Tenderness or Dysuria  CBC and differential [[id number]]  (Abnormal) Collected: 05/11/19 2227    Specimen: Blood Updated: 05/11/19 2236     WBC 9.14 x10 3/uL      Hgb 12.7 g/dL      Hematocrit 41.3 %      Platelets 308 x10 3/uL      RBC 4.31 x10 6/uL      MCV 86.8 fL      MCH 29.5 pg      MCHC 34.0 g/dL      RDW 11 %      MPV 8.8 fL      Neutrophils 56.7 %      Lymphocytes Automated 33.8 %      Monocytes 7.8 %      Eosinophils Automated 1.1 %      Basophils Automated 0.4 %      Immature Granulocytes 0.2 %      Nucleated RBC 0.0 /100 WBC      Neutrophils Absolute 5.18 x10 3/uL      Lymphocytes Absolute Automated 3.09 x10 3/uL      Monocytes Absolute Automated 0.71 x10 3/uL      Eosinophils Absolute Automated 0.10 x10 3/uL      Basophils Absolute Automated 0.04 x10 3/uL      Immature Granulocytes Absolute 0.02 x10 3/uL      Absolute NRBC 0.00 x10 3/uL     Narrative:      Replace urinary catheter prior to obtaining the urine culture  if it has been in place for greater than or equal to 14  days:->N/A No Foley  Indications for U/A Reflex to Micro - Reflex to  Culture:->Suprapubic Pain/Tenderness or Dysuria          Radiologic Studies  Radiology Results (24 Hour)     Procedure Component Value Units Date/Time    XR Chest  AP Portable [[id number]] Collected: 05/11/19 2312    Order Status: Completed Updated: 05/11/19 2316    Narrative:      HISTORY: chest pain, had Covid 1 month ago.  29 year old female. The  patient complains of chest pain. Known Covid-19 virus patient.    COMPARISON: No chest x-ray for comparison.    TECHNIQUE: XR CHEST AP PORTABLE: AP portable erect    FINDINGS: No indwelling lines are appreciated.  Multiple cardiac  monitoring wires overlie the radiographic field. There is no  silhouetting of the diaphragms or cardio-mediastinal  contours. The lungs  are well-expanded and clear. The pulmonary vasculature is within normal  limits. There is no pneumothorax or pleural effusion. The heart is not  enlarged. There is no mediastinal shift and the upper mediastinum does  not appear widened. Hilar structures are within normal limits. No free  air is detected under the diaphragms. No significant bony lesions are  seen. The bony thorax and surrounding bony structures appear intact. No  rib fractures are identified.      Impression:       Radiographic examination of the chest shows no acute  abnormality. The lungs are well-expanded and clear. There is no evidence  of pneumonia.        Miguel Dibble, MD   05/11/2019 11:13 PM          MDM and ED Course     ED Medication Orders (From admission, onward)    Start Ordered     Status Ordering Provider    05/11/19 2220 05/11/19 2219  sodium chloride 0.9 % bolus 1,000 mL  Once  Route: Intravenous  Ordered Dose: 1,000 mL     Last MAR action: Stopped Elohim Brune M             MDM  Number of Diagnoses or Management Options  Chest pain, unspecified type:   Diagnosis management comments: 29 year old female approximately [redacted] weeks pregnant with anxiety and chest pain, she has been off of her as needed Klonopin since she became pregnant, physical exam is not suggestive of particular cause of her chest pain, EKG chest x-ray labs are reassuring, D-dimer negative, heart rate normalized from the 120s to 86 with no intervention other than saline.  My overall impression is much of patient's etiology is secondary to stress and anxiety.  Recommended that she follow-up with GYN to suggest medication to better control her anxiety during pregnancy as she has been able to take her standard meds       Amount and/or Complexity of Data Reviewed  Clinical lab tests: ordered and reviewed  Tests in the radiology section of CPT: ordered and reviewed    Patient Progress  Patient progress: stable                   Procedures    Clinical  Impression & Disposition     Clinical Impression  Final diagnoses:   Chest pain, unspecified type        ED Disposition     ED Disposition Condition Date/Time Comment    Discharge  Thu May 11, 2019 11:50 PM Alvy Bimler discharge to home/self care.    Condition at disposition: Stable           Discharge Medication List as of 05/11/2019 11:50 PM          This note was generated by the Epic EMR system/ Dragon speech recognition and may contain inherent errors or omissions not intended by the user. Grammatical errors, random word insertions, deletions, pronoun errors and incomplete sentences are occasional consequences of this technology due to software limitations. Not all errors are caught or corrected. If there are questions or concerns about the content of this note or information contained within the body of this dictation they should be addressed directly with the author for clarification           Hewitt Blade, [place]  05/12/19 0229

## 2019-05-12 LAB — ECG 12-LEAD
Atrial Rate: 125 {beats}/min
P Axis: 76 degrees
P-R Interval: 120 ms
Q-T Interval: 312 ms
QRS Duration: 102 ms
QTC Calculation (Bezet): 450 ms
R Axis: 77 degrees
T Axis: 35 degrees
Ventricular Rate: 125 {beats}/min

## 2019-05-22 ENCOUNTER — Other Ambulatory Visit: Payer: Self-pay | Admitting: Obstetrics & Gynecology

## 2019-05-23 ENCOUNTER — Encounter (INDEPENDENT_AMBULATORY_CARE_PROVIDER_SITE_OTHER): Payer: Self-pay | Admitting: Obstetrics & Gynecology

## 2019-05-23 DIAGNOSIS — N912 Amenorrhea, unspecified: Secondary | ICD-10-CM

## 2019-05-24 NOTE — Progress Notes (Signed)
Patient has appointment with you tomorrow for confirmation of pregnancy.

## 2019-05-25 ENCOUNTER — Ambulatory Visit (INDEPENDENT_AMBULATORY_CARE_PROVIDER_SITE_OTHER): Payer: BLUE CROSS/BLUE SHIELD | Admitting: Advanced Practice Midwife

## 2019-05-25 VITALS — BP 116/74 | Ht 66.0 in | Wt 148.0 lb

## 2019-05-25 DIAGNOSIS — O09899 Supervision of other high risk pregnancies, unspecified trimester: Secondary | ICD-10-CM | POA: Insufficient documentation

## 2019-05-25 DIAGNOSIS — N912 Amenorrhea, unspecified: Secondary | ICD-10-CM

## 2019-05-25 DIAGNOSIS — Z3201 Encounter for pregnancy test, result positive: Secondary | ICD-10-CM

## 2019-05-25 NOTE — Progress Notes (Signed)
S:  Rhonda [name] is a [phone] who is a established patient in our practice, she presents to the office today with amenorrhea. She had a positive home pregnancy test.  Patient's last menstrual period was 04/01/2019. Based off LMP she is 7 weeks 5 days.  Her menstrual cycles are regular. Her obstetrical history is significant for SVD x 2 (2015 at [facility] complicated by gestational hypertension induced at 38 weeks, 2018 at [facility] at 39 weeks for suspected LGA). Current complaints include nausea with vomiting in the evening. Her brother's son has CF. Her sister is Gearldine Shown (our pt).      The following portions of the patient's history were reviewed and updated as needed: allergies, current medications, past medical/surgical history, family history, social history and problem list.     O:  .  Vitals:    05/25/19 1518   BP: 116/74   Weight: 148 lb (67.1 kg)   Height: 5\' 6"  (1.676 m)       General: NAD, A&A x 3, cooperative, appears stated age.     Sono on Monday at [facility] showed SIUP measuring 7 wks 4 days. S=D to LMP. FHR 184.       A:  Viable IUP at 7 wks 5 days  EDD 10/16.2021      P:  Discussed prenatal vitamins  Provided and reviewed prenatal booklet.   Follow up in 3 weeks for first initial prenatal visit which will include full history and physical and prenatal labs. Baseline PIH labs. Recommended ASA 162 mg qhs starting at 12 weeks. Will do CF carrier screening due to + Fhx.

## 2019-05-26 ENCOUNTER — Encounter (INDEPENDENT_AMBULATORY_CARE_PROVIDER_SITE_OTHER): Payer: BLUE CROSS/BLUE SHIELD | Admitting: Obstetrics & Gynecology

## 2019-05-29 ENCOUNTER — Encounter (INDEPENDENT_AMBULATORY_CARE_PROVIDER_SITE_OTHER): Payer: Self-pay

## 2019-06-07 NOTE — Progress Notes (Signed)
Last pap: 02/21/2019  Cats: No  Had chicken pox: Yes  Zika Travel: No

## 2019-06-14 ENCOUNTER — Encounter (INDEPENDENT_AMBULATORY_CARE_PROVIDER_SITE_OTHER): Payer: Self-pay | Admitting: Advanced Practice Midwife

## 2019-06-14 ENCOUNTER — Ambulatory Visit (INDEPENDENT_AMBULATORY_CARE_PROVIDER_SITE_OTHER): Payer: BLUE CROSS/BLUE SHIELD | Admitting: Advanced Practice Midwife

## 2019-06-14 VITALS — BP 118/84 | Wt 150.0 lb

## 2019-06-14 DIAGNOSIS — Z3A10 10 weeks gestation of pregnancy: Secondary | ICD-10-CM

## 2019-06-14 DIAGNOSIS — Z369 Encounter for antenatal screening, unspecified: Secondary | ICD-10-CM

## 2019-06-14 DIAGNOSIS — N898 Other specified noninflammatory disorders of vagina: Secondary | ICD-10-CM

## 2019-06-14 DIAGNOSIS — Z3481 Encounter for supervision of other normal pregnancy, first trimester: Secondary | ICD-10-CM

## 2019-06-14 LAB — HIV AG/AB 4TH GENERATION: HIV Ag/Ab, 4th Generation: NONREACTIVE

## 2019-06-14 NOTE — Progress Notes (Signed)
Subjective  This is the initial OB visit for SHADREKA RIOFRIO she is a [id number] who is an established patient in our practice.  She is at [redacted]w[redacted]d  per LMP, consistent with early dating sono.    Estimated Date of Delivery: 01/06/20. Patient's last menstrual period was 04/01/2019.. Menstrual cycles are regular.  Her obstetrical history is significant for: SVD x 2 (2015 at [facility] complicated by gestational hypertension induced at 38 weeks, 2018 at [facility] at 39 weeks for suspected LGA). Relationship status:Married. Current complaints include nausea. Her brother is a CF carrier and his child has CF. Her sister is our patient as well Gearldine Shown). Pt also c/o some vaginal irritation/itching-concerned she may have a yeast infection.     The following portions of the patient's history were reviewed and updated as needed: allergies, current medications, past family history, past medical history, past surgical history, social history and problem list.    Objective  Physical exam as noted in chart.    Assessment:   Viable IUP at [redacted]w[redacted]d  Hx of GHTN in G1    Plan:  Initial Ob labs drawn,  Additions noted in orders. Culture obtained.   Pap Smear current  Discussed genetic testing as appropriate (CF, SMA, Fragile X, Tay-Sachs)  CF/SMA/Fragile X-Drawn today  Discussed prenatal screenings and diagnostic tests options reviewed (NT, CVS, Amniocentesis, free cell DNA testing): NT ordered  Questions answered   Prenatal booklet provided and reviewed as appropriate   AFP discussed as appropriate,will draw between 15-18 weeks if pt requests  Referrals - [facility] for NT   Recommended ASA 162 mg qhs starting at 12 weeks.   Follow up in 5 weeks

## 2019-06-15 ENCOUNTER — Other Ambulatory Visit (INDEPENDENT_AMBULATORY_CARE_PROVIDER_SITE_OTHER): Payer: Self-pay | Admitting: Obstetrics & Gynecology

## 2019-06-15 DIAGNOSIS — Z3481 Encounter for supervision of other normal pregnancy, first trimester: Secondary | ICD-10-CM

## 2019-06-15 DIAGNOSIS — Z369 Encounter for antenatal screening, unspecified: Secondary | ICD-10-CM

## 2019-06-15 DIAGNOSIS — Z3A10 10 weeks gestation of pregnancy: Secondary | ICD-10-CM

## 2019-06-16 LAB — OB PANEL: CBCD,UA/CX, ABO/RH/AB SCREEN, RPR-RFX, HIV, HBSAG, HCV, RUB
AB Screen Gel: NEGATIVE
Baso(Absolute): 0 10*3/uL (ref 0.0–0.2)
Basos: 0 %
Bilirubin, UA: NEGATIVE
Blood, UA: NEGATIVE
Eos: 1 %
Eosinophils Absolute: 0.1 10*3/uL (ref 0.0–0.4)
Glucose, UA: NEGATIVE
HCV AB: <0.1 s/co ratio (ref 0.0–0.9)
HIV Screen 4th Generation wRfx: NONREACTIVE
Hematocrit: 36.7 % (ref 34.0–46.6)
Hemoglobin: 12.1 g/dL (ref 11.1–15.9)
Hepatitis B Surface Antigen: NEGATIVE
Immature Granulocytes Absolute: 0 10*3/uL (ref 0.0–0.1)
Immature Granulocytes: 0 %
Ketones UA: NEGATIVE
Leukocyte Esterase, UA: NEGATIVE
Lymphocytes Absolute: 2.3 10*3/uL (ref 0.7–3.1)
Lymphocytes: 24 %
MCH: 29.5 pg (ref 26.6–33.0)
MCHC: 33 g/dL (ref 31.5–35.7)
MCV: 90 fL (ref 79–97)
Monocytes Absolute: 0.6 10*3/uL (ref 0.1–0.9)
Monocytes: 6 %
Neutrophils Absolute: 6.6 10*3/uL (ref 1.4–7.0)
Neutrophils: 69 %
Nitrite, UA: NEGATIVE
Platelets: 296 10*3/uL (ref 150–450)
Protein, UA: NEGATIVE
RBC: 4.1 x10E6/uL (ref 3.77–5.28)
RDW: 11.8 % (ref 11.7–15.4)
RPR: NONREACTIVE
Result 1:: NO GROWTH
Rh Factor: POSITIVE
Rubella AB, IgG: 1.15 index (ref >0.99)
Urine Specific Gravity: 1.022 (ref 1.005–1.030)
Urobilinogen, Ur: 0.2 mg/dL (ref 0.2–1.0)
WBC: 9.7 10*3/uL (ref 3.4–10.8)
pH, UA: >=9 — AB (ref 5.0–7.5)

## 2019-06-16 LAB — INTERPRETATION:

## 2019-06-19 ENCOUNTER — Encounter (INDEPENDENT_AMBULATORY_CARE_PROVIDER_SITE_OTHER): Payer: Self-pay | Admitting: Advanced Practice Midwife

## 2019-06-19 NOTE — Progress Notes (Signed)
Normal prenatal labs. Pt notified via [facility]

## 2019-06-21 LAB — NUSWAB BV AND CANDIDA, NAA
Candida albicans, NAA: POSITIVE — AB
Candida glabrata, NAA: NEGATIVE

## 2019-06-23 ENCOUNTER — Telehealth (INDEPENDENT_AMBULATORY_CARE_PROVIDER_SITE_OTHER): Payer: Self-pay

## 2019-06-23 NOTE — Progress Notes (Signed)
Vaginal cultures positive for yeast. Please have her use monistat 7 OTC. Thanks

## 2019-06-23 NOTE — Telephone Encounter (Signed)
-----   Message from Heather Roberts, CNM sent at 06/23/2019  8:07 AM EDT -----  Vaginal cultures positive for yeast. Please have her use monistat 7 OTC. Thanks

## 2019-06-23 NOTE — Telephone Encounter (Signed)
Called and spoke to the patient. Test results were verbally delivered. Patient will start using Monistat 7 asap. Thank you.

## 2019-06-30 ENCOUNTER — Ambulatory Visit
Admission: RE | Admit: 2019-06-30 | Discharge: 2019-06-30 | Disposition: A | Discharge status: Home / Self Care | Payer: BLUE CROSS/BLUE SHIELD | Source: Ambulatory Visit | Attending: Obstetrics & Gynecology | Admitting: Obstetrics & Gynecology

## 2019-06-30 DIAGNOSIS — Z3A10 10 weeks gestation of pregnancy: Secondary | ICD-10-CM

## 2019-06-30 DIAGNOSIS — Z3A12 12 weeks gestation of pregnancy: Secondary | ICD-10-CM

## 2019-06-30 DIAGNOSIS — Z369 Encounter for antenatal screening, unspecified: Secondary | ICD-10-CM

## 2019-06-30 DIAGNOSIS — O10011 Pre-existing essential hypertension complicating pregnancy, first trimester: Secondary | ICD-10-CM | POA: Insufficient documentation

## 2019-06-30 DIAGNOSIS — Z3481 Encounter for supervision of other normal pregnancy, first trimester: Secondary | ICD-10-CM

## 2019-06-30 DIAGNOSIS — O09291 Supervision of pregnancy with other poor reproductive or obstetric history, first trimester: Secondary | ICD-10-CM | POA: Insufficient documentation

## 2019-06-30 DIAGNOSIS — Z3682 Encounter for antenatal screening for nuchal translucency: Secondary | ICD-10-CM

## 2019-06-30 DIAGNOSIS — O26891 Other specified pregnancy related conditions, first trimester: Secondary | ICD-10-CM

## 2019-07-02 NOTE — Progress Notes (Signed)
Normal ultrasound findings of nuchal screen. Await final result.    Becca - This pt's carrier screening testing was cancelled - cost issue. Can we just do the CF for a lesser cost to the pt? Seee chart note re reason for CF testing. THANKS!

## 2019-07-03 ENCOUNTER — Other Ambulatory Visit (INDEPENDENT_AMBULATORY_CARE_PROVIDER_SITE_OTHER): Payer: Self-pay | Admitting: Obstetrics & Gynecology

## 2019-07-03 DIAGNOSIS — O359XX0 Maternal care for (suspected) fetal abnormality and damage, unspecified, not applicable or unspecified: Secondary | ICD-10-CM

## 2019-07-03 NOTE — Progress Notes (Signed)
Can we call [employer] and find out why the carrier screening was so expensive that she cancelled it. She has a family hx of CF, it should be covered. Thanks

## 2019-07-04 ENCOUNTER — Telehealth (INDEPENDENT_AMBULATORY_CARE_PROVIDER_SITE_OTHER): Payer: Self-pay

## 2019-07-04 NOTE — Telephone Encounter (Signed)
Spoke to pt, there was some confusion w/ labs and insurance so pt cancelled test afraid she was going to be billed thousands of dollars. Pt will have CF drawn at next visit ans sent through [employer] as self pay, pt is aware that this cost os only $149.

## 2019-07-04 NOTE — Telephone Encounter (Signed)
-----   Message from Heather Roberts, CNM sent at 07/03/2019 11:20 AM EDT -----  Can we call [employer] and find out why the carrier screening was so expensive that she cancelled it. She has a family hx of CF, it should be covered. Thanks

## 2019-07-06 ENCOUNTER — Encounter (INDEPENDENT_AMBULATORY_CARE_PROVIDER_SITE_OTHER): Payer: Self-pay | Admitting: Advanced Practice Midwife

## 2019-07-07 ENCOUNTER — Telehealth (INDEPENDENT_AMBULATORY_CARE_PROVIDER_SITE_OTHER): Payer: Self-pay

## 2019-07-07 ENCOUNTER — Encounter (INDEPENDENT_AMBULATORY_CARE_PROVIDER_SITE_OTHER): Payer: Self-pay | Admitting: Advanced Practice Midwife

## 2019-07-07 ENCOUNTER — Telehealth (HOSPITAL_BASED_OUTPATIENT_CLINIC_OR_DEPARTMENT_OTHER): Payer: Self-pay

## 2019-07-07 NOTE — Telephone Encounter (Signed)
Called and spoke to the patient. Test results were verbally delivered. Thank you.

## 2019-07-07 NOTE — Progress Notes (Signed)
CF/SMA/Fragile X carrier screening negative. Pt notified via [facility]

## 2019-07-07 NOTE — Progress Notes (Signed)
NIPT screen negative, please let her know and if she would like to know the gender she is having a boy. Thanks.

## 2019-07-07 NOTE — Telephone Encounter (Signed)
-----   Message from Heather Roberts, CNM sent at 07/07/2019  1:31 PM EDT -----  NIPT screen negative, please let her know and if she would like to know the gender she is having a boy. Thanks.

## 2019-07-07 NOTE — Telephone Encounter (Signed)
Spoke with pt and informed her of normal FTS results. Results to be scanned to OB.

## 2019-07-12 ENCOUNTER — Telehealth (INDEPENDENT_AMBULATORY_CARE_PROVIDER_SITE_OTHER): Payer: Self-pay

## 2019-07-12 NOTE — Telephone Encounter (Signed)
Pt calling states she called answering service one hour ago and did not receive call from MD on call. Pt wants to be sure she does not get charged.  Pt believes she has food poisoning and wonders if there is anything she should be aware of. Talked with pt about ways to stay hydrated. Told pt if still having nausea or vomiting after 24 hours of if she feels dehydrated to call.

## 2019-07-17 ENCOUNTER — Ambulatory Visit (INDEPENDENT_AMBULATORY_CARE_PROVIDER_SITE_OTHER): Payer: BLUE CROSS/BLUE SHIELD | Admitting: Advanced Practice Midwife

## 2019-07-19 ENCOUNTER — Ambulatory Visit (INDEPENDENT_AMBULATORY_CARE_PROVIDER_SITE_OTHER): Payer: BLUE CROSS/BLUE SHIELD | Admitting: Obstetrics & Gynecology

## 2019-07-19 VITALS — BP 110/74 | Wt 148.0 lb

## 2019-07-19 DIAGNOSIS — Z3482 Encounter for supervision of other normal pregnancy, second trimester: Secondary | ICD-10-CM

## 2019-07-19 DIAGNOSIS — Z369 Encounter for antenatal screening, unspecified: Secondary | ICD-10-CM

## 2019-07-19 DIAGNOSIS — Z3A15 15 weeks gestation of pregnancy: Secondary | ICD-10-CM

## 2019-07-19 NOTE — Progress Notes (Signed)
No FM yet. No complaints. Questions answered. AFP done. Level II sono order provided. The pt was sent to our practice by Gearldine Shown - her sister - who delivered all of her babies with our practice. The pt is VERY happy with her decision to come to [place].

## 2019-07-21 LAB — ALPHA FETOPROTEIN, MATERNAL
Gestational Age Based On:: 15.6 weeks
MSAFP MoM: 0.76
Maternal Age At EDD: 30.1 yr
Maternal Serum AFP: 24.7 ng/mL
Osb Risk: 10000
Results:: NEGATIVE
Weight: 150 [lb_av]

## 2019-07-21 NOTE — Progress Notes (Signed)
AFP is negative for open NTD.

## 2019-08-18 ENCOUNTER — Ambulatory Visit (INDEPENDENT_AMBULATORY_CARE_PROVIDER_SITE_OTHER): Payer: BLUE CROSS/BLUE SHIELD | Admitting: Obstetrics & Gynecology

## 2019-08-18 VITALS — BP 102/70 | Wt 150.0 lb

## 2019-08-18 DIAGNOSIS — Z369 Encounter for antenatal screening, unspecified: Secondary | ICD-10-CM

## 2019-08-18 DIAGNOSIS — Z3A19 19 weeks gestation of pregnancy: Secondary | ICD-10-CM

## 2019-08-18 DIAGNOSIS — Z3482 Encounter for supervision of other normal pregnancy, second trimester: Secondary | ICD-10-CM

## 2019-08-18 NOTE — Progress Notes (Signed)
Feeling well. Having a boy! (has 2 girls at home). Level II sono scheduled.

## 2019-08-31 ENCOUNTER — Ambulatory Visit
Admission: RE | Admit: 2019-08-31 | Discharge: 2019-08-31 | Disposition: A | Discharge status: Home / Self Care | Payer: BLUE CROSS/BLUE SHIELD | Source: Ambulatory Visit | Attending: Obstetrics & Gynecology | Admitting: Obstetrics & Gynecology

## 2019-08-31 DIAGNOSIS — Z3A21 21 weeks gestation of pregnancy: Secondary | ICD-10-CM

## 2019-08-31 DIAGNOSIS — O09292 Supervision of pregnancy with other poor reproductive or obstetric history, second trimester: Secondary | ICD-10-CM | POA: Insufficient documentation

## 2019-08-31 DIAGNOSIS — O359XX0 Maternal care for (suspected) fetal abnormality and damage, unspecified, not applicable or unspecified: Secondary | ICD-10-CM | POA: Insufficient documentation

## 2019-08-31 DIAGNOSIS — O26892 Other specified pregnancy related conditions, second trimester: Secondary | ICD-10-CM

## 2019-08-31 NOTE — Progress Notes (Signed)
Normal Level II anatomy scan. Third trimester ultrasound to evaluate fetal growth and for poly (had it with second pregnancy). Aspirin 162 mg recommended for PEC prophylaxis until at least 37 weeks (OK to continue to delivery).

## 2019-09-19 ENCOUNTER — Ambulatory Visit (INDEPENDENT_AMBULATORY_CARE_PROVIDER_SITE_OTHER): Payer: BLUE CROSS/BLUE SHIELD | Admitting: Obstetrics and Gynecology

## 2019-09-19 ENCOUNTER — Ambulatory Visit (INDEPENDENT_AMBULATORY_CARE_PROVIDER_SITE_OTHER): Payer: BLUE CROSS/BLUE SHIELD | Admitting: Obstetrics & Gynecology

## 2019-09-19 VITALS — BP 110/74 | Wt 157.0 lb

## 2019-09-19 DIAGNOSIS — Z3482 Encounter for supervision of other normal pregnancy, second trimester: Secondary | ICD-10-CM

## 2019-09-19 DIAGNOSIS — Z3A24 24 weeks gestation of pregnancy: Secondary | ICD-10-CM

## 2019-09-19 DIAGNOSIS — Z369 Encounter for antenatal screening, unspecified: Secondary | ICD-10-CM

## 2019-09-19 NOTE — Progress Notes (Signed)
Glucola ordered

## 2019-10-12 ENCOUNTER — Ambulatory Visit (INDEPENDENT_AMBULATORY_CARE_PROVIDER_SITE_OTHER): Payer: BLUE CROSS/BLUE SHIELD | Admitting: Obstetrics & Gynecology

## 2019-10-12 VITALS — BP 108/72 | Wt 158.0 lb

## 2019-10-12 DIAGNOSIS — Z3A27 27 weeks gestation of pregnancy: Secondary | ICD-10-CM

## 2019-10-12 DIAGNOSIS — Z3482 Encounter for supervision of other normal pregnancy, second trimester: Secondary | ICD-10-CM

## 2019-10-12 DIAGNOSIS — Z369 Encounter for antenatal screening, unspecified: Secondary | ICD-10-CM

## 2019-10-12 NOTE — Progress Notes (Signed)
Glucola today. EFW sonogram in office at 32 weeks Boy baby and desires circumcision.

## 2019-10-13 ENCOUNTER — Telehealth (INDEPENDENT_AMBULATORY_CARE_PROVIDER_SITE_OTHER): Payer: Self-pay

## 2019-10-13 LAB — CBC AND DIFFERENTIAL
Baso(Absolute): 0 10*3/uL (ref 0.0–0.2)
Basos: 0 %
Eos: 3 %
Eosinophils Absolute: 0.3 10*3/uL (ref 0.0–0.4)
Hematocrit: 32.2 % — ABNORMAL LOW (ref 34.0–46.6)
Hemoglobin: 10.4 g/dL — ABNORMAL LOW (ref 11.1–15.9)
Immature Granulocytes Absolute: 0.1 10*3/uL (ref 0.0–0.1)
Immature Granulocytes: 1 %
Lymphocytes Absolute: 2 10*3/uL (ref 0.7–3.1)
Lymphocytes: 21 %
MCH: 29 pg (ref 26.6–33.0)
MCHC: 32.3 g/dL (ref 31.5–35.7)
MCV: 90 fL (ref 79–97)
Monocytes Absolute: 0.8 10*3/uL (ref 0.1–0.9)
Monocytes: 8 %
Neutrophils Absolute: 6.7 10*3/uL (ref 1.4–7.0)
Neutrophils: 67 %
Platelets: 246 10*3/uL (ref 150–450)
RBC: 3.59 x10E6/uL — ABNORMAL LOW (ref 3.77–5.28)
RDW: 11.8 % (ref 11.7–15.4)
WBC: 9.9 10*3/uL (ref 3.4–10.8)

## 2019-10-13 LAB — GESTATIONAL DIABETES 1-HOUR SCREEN, SERUM: Gestational Diabetes Screen: 79 mg/dL (ref 65–139)

## 2019-10-13 NOTE — Telephone Encounter (Signed)
-----   Message from [name], MD sent at 10/13/2019  9:42 AM EDT -----  Glucola 79, HCT 32. Recommend vitron C daily. PLT 246

## 2019-10-13 NOTE — Telephone Encounter (Signed)
Spoke w/ pt directly. Pt is aware 1hr glucola and CBC results is to take Vitron c daily

## 2019-11-06 ENCOUNTER — Encounter (INDEPENDENT_AMBULATORY_CARE_PROVIDER_SITE_OTHER): Payer: Self-pay | Admitting: Advanced Practice Midwife

## 2019-11-13 ENCOUNTER — Ambulatory Visit (INDEPENDENT_AMBULATORY_CARE_PROVIDER_SITE_OTHER): Payer: BLUE CROSS/BLUE SHIELD | Admitting: Obstetrics & Gynecology

## 2019-11-13 ENCOUNTER — Encounter (INDEPENDENT_AMBULATORY_CARE_PROVIDER_SITE_OTHER): Payer: BLUE CROSS/BLUE SHIELD

## 2019-11-15 ENCOUNTER — Ambulatory Visit (INDEPENDENT_AMBULATORY_CARE_PROVIDER_SITE_OTHER): Payer: BLUE CROSS/BLUE SHIELD | Admitting: Obstetrics & Gynecology

## 2019-11-15 VITALS — BP 100/68 | Wt 161.0 lb

## 2019-11-15 DIAGNOSIS — Z369 Encounter for antenatal screening, unspecified: Secondary | ICD-10-CM

## 2019-11-15 DIAGNOSIS — Z3689 Encounter for other specified antenatal screening: Secondary | ICD-10-CM

## 2019-11-15 DIAGNOSIS — Z3A32 32 weeks gestation of pregnancy: Secondary | ICD-10-CM

## 2019-11-15 DIAGNOSIS — Z3493 Encounter for supervision of normal pregnancy, unspecified, third trimester: Secondary | ICD-10-CM

## 2019-11-15 NOTE — Progress Notes (Signed)
Sonogram in office today. EFW 57%, AFI 17, vertex, HC 90%, BPD 95%. Good fetal movement. Counseled on COVID vaccine.

## 2019-11-21 ENCOUNTER — Encounter (INDEPENDENT_AMBULATORY_CARE_PROVIDER_SITE_OTHER): Payer: Self-pay | Admitting: Obstetrics & Gynecology

## 2019-11-26 ENCOUNTER — Emergency Department
Admission: EM | Admit: 2019-11-26 | Discharge: 2019-11-26 | Disposition: A | Discharge status: Home / Self Care | Payer: BLUE CROSS/BLUE SHIELD | Attending: Emergency Medicine | Admitting: Emergency Medicine

## 2019-11-26 ENCOUNTER — Emergency Department: Payer: BLUE CROSS/BLUE SHIELD

## 2019-11-26 DIAGNOSIS — O368130 Decreased fetal movements, third trimester, not applicable or unspecified: Secondary | ICD-10-CM

## 2019-11-26 DIAGNOSIS — Z3A34 34 weeks gestation of pregnancy: Secondary | ICD-10-CM | POA: Insufficient documentation

## 2019-11-26 DIAGNOSIS — O368120 Decreased fetal movements, second trimester, not applicable or unspecified: Secondary | ICD-10-CM

## 2019-11-26 NOTE — OB ED Provider Note (Signed)
[facility] DELIVERY ADMISSION H&P    Date Time: 11/25/2109:08 AM  Room#WOBED11/WOBED11        Subjective:  Rhonda [name] is a 29 y.o. G3 P2 female with a gestation age of [redacted]w[redacted]d and Estimated Date of Delivery: 01/06/20 who presents to the hospital for observation.  Her current obstetrical history is significant for none.  Patient reports decreased fetal movement.       Past Medical History:   Diagnosis Date    Anemia     Anxiety     Chlamydia 04/16/2016    treated; tested negative 06/12/16    Hypertension     chronic but not on meds and gestational with first pregnancy         Objective:    Vital Signs:  Heart Rate:  [89-100] 89  BP: (107-133)/(68-89) 107/68      Review of Systems:   General appearance - alert, well appearing, and in no distress  Chest - clear to auscultation, no wheezes, rales or rhonchi, symmetric air entry  Heart - normal rate, regular rhythm, normal S1, S2, no murmurs, rubs, clicks or gallops  Abdomen - soft nontender gravid  Neurological - alert, oriented, normal speech, no focal findings or movement disorder noted  Extremities - peripheral pulses normal, no pedal edema, no clubbing or cyanosis        Cervical Exam:       FHT: Baseline Rate: 135 BPM      Labs:     Results     ** No results found for the last 24 hours. **                   No Known Allergies    No current facility-administered medications for this encounter.     Current Outpatient Medications   Medication Sig Dispense Refill    BABY ASPIRIN PO Take by mouth      Prenatal Vit-Fe Fumarate-FA (PRENATAL VITAMIN PO) Take by mouth         OB History     Gravida   3    Para   2    Term   2    Preterm   0    AB   0    Living   2       SAB   0    TAB   0    Ectopic   0    Multiple   0    Live Births   2                 Active Problems:    * No active hospital problems. *      Past Surgical History:   Procedure Laterality Date    ENDOSCOPY UPPER GI      WISDOM TOOTH EXTRACTION       Risks, benefits, alternatives and  possible complications have been discussed in detail with the patient.  Pre-admission, admission, and post admission procedures and expectations were discussed in detail.  All questions answered, all appropriate consents will be signed at the Hospital.     Assessment/Plan:  Patient is a 29 y.o., G3 P2 [redacted]w[redacted]d with decreased fetal movement  -sonogram BPP 8/8 normal AFI  -patient now feeling baby move  -discharge home  Rhonda [name]

## 2019-11-26 NOTE — ED Notes (Signed)
Dr. Sharlett Iles called with [place] results. Patient D/C to home

## 2019-11-26 NOTE — Discharge Instructions (Signed)
Home Undelivered Discharge Instructions    After Discharge Orders:    Future Appointments   Date Time Provider Department Center   11/28/2019  3:45 PM Palma Holter, MD [facility]   12/14/2019  3:45 PM Starleen Blue, MD [facility]   12/22/2019  3:45 PM Doristine Mango, DO [facility]   12/29/2019  3:45 PM Madelynn Done, MD [facility]   01/05/2020  1:00 PM [name], MD [facility]       Call physician or midwife's office on *** for instructions.       Medication List      ASK your doctor about these medications    BABY ASPIRIN PO     PRENATAL VITAMIN PO                     Diet:  normal diet as tolerated     Rest: normal activity as tolerated    Other instructions: Do kick counts once a day on your baby. Choose the time of day your baby is most active. Get in a comfortable lying or sitting position and time how long it takes to feel 10 kicks, twists, turns, swishes, or rolls. Call your physician or midwife if there have not been 10 kicks in 1 hours    Call physician or midwife, return to Labor and Delivery, call 911, or go to the nearest Emergency Room if: increased leakage or fluid, contractions more than  5 per  1 hour, decreased fetal movement, persistent low back pain or cramping or bleeding from vaginal area

## 2019-11-28 ENCOUNTER — Ambulatory Visit (INDEPENDENT_AMBULATORY_CARE_PROVIDER_SITE_OTHER): Payer: BLUE CROSS/BLUE SHIELD | Admitting: Obstetrics & Gynecology

## 2019-11-28 VITALS — BP 102/76 | Wt 162.0 lb

## 2019-11-28 DIAGNOSIS — Z369 Encounter for antenatal screening, unspecified: Secondary | ICD-10-CM

## 2019-11-28 DIAGNOSIS — Z3A34 34 weeks gestation of pregnancy: Secondary | ICD-10-CM

## 2019-11-28 DIAGNOSIS — Z3483 Encounter for supervision of other normal pregnancy, third trimester: Secondary | ICD-10-CM

## 2019-11-28 NOTE — Progress Notes (Signed)
Patient has not been vaccinated for Covid-19. She is planning to get one postpartum.

## 2019-11-28 NOTE — Progress Notes (Signed)
+   FM. The pt was seen in the ObED this past weekend for decreased FM. Sono with BPP 8/8 and normal fluid. Since that time the pt does feel some more movement than she was. Is doing kick counts. Will continue to closely follow. The pt wants induction at 39 weeks and we will do on October 11. Will schedule 2 weeks prior.

## 2019-12-13 ENCOUNTER — Ambulatory Visit (INDEPENDENT_AMBULATORY_CARE_PROVIDER_SITE_OTHER): Payer: BLUE CROSS/BLUE SHIELD | Admitting: Obstetrics & Gynecology

## 2019-12-13 VITALS — BP 130/88 | Wt 161.0 lb

## 2019-12-13 DIAGNOSIS — O1213 Gestational proteinuria, third trimester: Secondary | ICD-10-CM

## 2019-12-13 DIAGNOSIS — Z3483 Encounter for supervision of other normal pregnancy, third trimester: Secondary | ICD-10-CM

## 2019-12-13 DIAGNOSIS — Z369 Encounter for antenatal screening, unspecified: Secondary | ICD-10-CM

## 2019-12-13 DIAGNOSIS — Z3A36 36 weeks gestation of pregnancy: Secondary | ICD-10-CM

## 2019-12-13 DIAGNOSIS — Z8759 Personal history of other complications of pregnancy, childbirth and the puerperium: Secondary | ICD-10-CM

## 2019-12-13 NOTE — Addendum Note (Signed)
Addended by: Diamond Nickel D. on: 12/13/2019 10:17 AM     Modules accepted: Orders

## 2019-12-13 NOTE — Progress Notes (Signed)
GBS sent. Recommended Tdap and flu shot today. Labor precautions given. Will check PIH labs today in the office.

## 2019-12-14 ENCOUNTER — Telehealth (INDEPENDENT_AMBULATORY_CARE_PROVIDER_SITE_OTHER): Payer: Self-pay

## 2019-12-14 ENCOUNTER — Ambulatory Visit (INDEPENDENT_AMBULATORY_CARE_PROVIDER_SITE_OTHER): Payer: BLUE CROSS/BLUE SHIELD | Admitting: Obstetrics & Gynecology

## 2019-12-14 LAB — COMPREHENSIVE METABOLIC PANEL
ALT: 25 IU/L (ref 0–32)
AST (SGOT): 28 IU/L (ref 0–40)
African American eGFR: 113 mL/min/{1.73_m2} (ref >59)
Albumin/Globulin Ratio: 1.4 (ref 1.2–2.2)
Albumin: 3.6 g/dL — ABNORMAL LOW (ref 3.9–5.0)
Alkaline Phosphatase: 199 IU/L — ABNORMAL HIGH (ref 44–121)
BUN / Creatinine Ratio: 10 (ref 9–23)
BUN: 8 mg/dL (ref 6–20)
Bilirubin, Total: 0.3 mg/dL (ref 0.0–1.2)
CO2: 22 mmol/L (ref 20–29)
Calcium: 9.7 mg/dL (ref 8.7–10.2)
Chloride: 103 mmol/L (ref 96–106)
Creatinine: 0.81 mg/dL (ref 0.57–1.00)
Globulin, Total: 2.5 g/dL (ref 1.5–4.5)
Glucose: 78 mg/dL (ref 65–99)
Potassium: 4.1 mmol/L (ref 3.5–5.2)
Protein, Total: 6.1 g/dL (ref 6.0–8.5)
Sodium: 137 mmol/L (ref 134–144)
non-African American eGFR: 98 mL/min/{1.73_m2} (ref >59)

## 2019-12-14 LAB — CBC AND DIFFERENTIAL
Baso(Absolute): 0 10*3/uL (ref 0.0–0.2)
Basos: 0 %
Eos: 2 %
Eosinophils Absolute: 0.2 10*3/uL (ref 0.0–0.4)
Hematocrit: 30.6 % — ABNORMAL LOW (ref 34.0–46.6)
Hemoglobin: 9.8 g/dL — ABNORMAL LOW (ref 11.1–15.9)
Immature Granulocytes Absolute: 0.1 10*3/uL (ref 0.0–0.1)
Immature Granulocytes: 1 %
Lymphocytes Absolute: 2.3 10*3/uL (ref 0.7–3.1)
Lymphocytes: 24 %
MCH: 26.5 pg — ABNORMAL LOW (ref 26.6–33.0)
MCHC: 32 g/dL (ref 31.5–35.7)
MCV: 83 fL (ref 79–97)
Monocytes Absolute: 0.8 10*3/uL (ref 0.1–0.9)
Monocytes: 9 %
Neutrophils Absolute: 6.2 10*3/uL (ref 1.4–7.0)
Neutrophils: 64 %
Platelets: 293 10*3/uL (ref 150–450)
RBC: 3.7 x10E6/uL — ABNORMAL LOW (ref 3.77–5.28)
RDW: 11.6 % — ABNORMAL LOW (ref 11.7–15.4)
WBC: 9.5 10*3/uL (ref 3.4–10.8)

## 2019-12-14 LAB — URIC ACID: Uric acid: 4.8 mg/dL (ref 2.6–6.2)

## 2019-12-14 LAB — LACTATE DEHYDROGENASE: LDH: 231 IU/L — ABNORMAL HIGH (ref 119–226)

## 2019-12-14 NOTE — Telephone Encounter (Signed)
Pt called in upset x3 hours after seeing lab results on [facility]. Reviewed labs with pt, reassurance provided. Will let Dr. Murrell Converse know.

## 2019-12-15 LAB — GROUP B STREPTOCOCCUS COLONIZATION DETECTION, NAA REFLEX TO SUSCEPT.: Strep Gp B NAA+Rflx: NEGATIVE

## 2019-12-15 NOTE — Progress Notes (Signed)
Spoke with pt regarding her nl PIH lab results. H/H ais still low (9.8/30.6), and LDH is slightly elevated at 231. Pt admits to not being regular about taking her Vitron C. Advised to to take it daily with a stool softener if needed.

## 2019-12-15 NOTE — Progress Notes (Signed)
GBS negative.

## 2019-12-20 ENCOUNTER — Telehealth (INDEPENDENT_AMBULATORY_CARE_PROVIDER_SITE_OTHER): Payer: Self-pay | Admitting: Obstetrics & Gynecology

## 2019-12-20 NOTE — Telephone Encounter (Signed)
Spoke to patient Induction scheduled for 01/01/2020 with Dr. Murrell Converse at 6:15 am. Pt aware will go to the green entrance

## 2019-12-22 ENCOUNTER — Ambulatory Visit (INDEPENDENT_AMBULATORY_CARE_PROVIDER_SITE_OTHER): Payer: BLUE CROSS/BLUE SHIELD | Admitting: Obstetrics & Gynecology

## 2019-12-22 ENCOUNTER — Telehealth (INDEPENDENT_AMBULATORY_CARE_PROVIDER_SITE_OTHER): Payer: Self-pay

## 2019-12-22 VITALS — BP 118/78 | Wt 162.0 lb

## 2019-12-22 DIAGNOSIS — Z3A37 37 weeks gestation of pregnancy: Secondary | ICD-10-CM

## 2019-12-22 DIAGNOSIS — O09293 Supervision of pregnancy with other poor reproductive or obstetric history, third trimester: Secondary | ICD-10-CM

## 2019-12-22 DIAGNOSIS — Z8759 Personal history of other complications of pregnancy, childbirth and the puerperium: Secondary | ICD-10-CM

## 2019-12-22 DIAGNOSIS — Z369 Encounter for antenatal screening, unspecified: Secondary | ICD-10-CM

## 2019-12-22 DIAGNOSIS — Z3483 Encounter for supervision of other normal pregnancy, third trimester: Secondary | ICD-10-CM

## 2019-12-22 DIAGNOSIS — O1213 Gestational proteinuria, third trimester: Secondary | ICD-10-CM

## 2019-12-22 NOTE — Progress Notes (Signed)
Consents signed today. Reports HA for past 3 days, but has not taken any medication. Encouraged to take 1g Tylenol q6 hr.  Discussed if HA not resolving with tylenol to let us know. Hydration encouraged. PIH labs collected.

## 2019-12-22 NOTE — Telephone Encounter (Signed)
Pt calling with c/o headache for the last three days. When questioned pt has not taken anything for the headache. Encourage pt to take Tylenol. Pt denies visual changes,edema, or epigastric pain. Pt has appointment scheduled for today.

## 2019-12-23 LAB — CBC AND DIFFERENTIAL
Baso(Absolute): 0.1 10*3/uL (ref 0.0–0.2)
Basos: 1 %
Eos: 1 %
Eosinophils Absolute: 0.1 10*3/uL (ref 0.0–0.4)
Hematocrit: 31.6 % — ABNORMAL LOW (ref 34.0–46.6)
Hemoglobin: 9.8 g/dL — ABNORMAL LOW (ref 11.1–15.9)
Immature Granulocytes Absolute: 0.1 10*3/uL (ref 0.0–0.1)
Immature Granulocytes: 1 %
Lymphocytes Absolute: 2.5 10*3/uL (ref 0.7–3.1)
Lymphocytes: 23 %
MCH: 25.4 pg — ABNORMAL LOW (ref 26.6–33.0)
MCHC: 31 g/dL — ABNORMAL LOW (ref 31.5–35.7)
MCV: 82 fL (ref 79–97)
Monocytes Absolute: 0.8 10*3/uL (ref 0.1–0.9)
Monocytes: 7 %
Neutrophils Absolute: 7.5 10*3/uL — ABNORMAL HIGH (ref 1.4–7.0)
Neutrophils: 67 %
Platelets: 316 10*3/uL (ref 150–450)
RBC: 3.86 x10E6/uL (ref 3.77–5.28)
RDW: 11.9 % (ref 11.7–15.4)
WBC: 11.1 10*3/uL — ABNORMAL HIGH (ref 3.4–10.8)

## 2019-12-23 LAB — COMPREHENSIVE METABOLIC PANEL
ALT: 25 IU/L (ref 0–32)
AST (SGOT): 29 IU/L (ref 0–40)
African American eGFR: 118 mL/min/{1.73_m2} (ref >59)
Albumin/Globulin Ratio: 1.3 (ref 1.2–2.2)
Albumin: 3.6 g/dL — ABNORMAL LOW (ref 3.9–5.0)
Alkaline Phosphatase: 220 IU/L — ABNORMAL HIGH (ref 44–121)
BUN / Creatinine Ratio: 10 (ref 9–23)
BUN: 8 mg/dL (ref 6–20)
Bilirubin, Total: 0.3 mg/dL (ref 0.0–1.2)
CO2: 25 mmol/L (ref 20–29)
Calcium: 9 mg/dL (ref 8.7–10.2)
Chloride: 102 mmol/L (ref 96–106)
Creatinine: 0.78 mg/dL (ref 0.57–1.00)
Globulin, Total: 2.8 g/dL (ref 1.5–4.5)
Glucose: 69 mg/dL (ref 65–99)
Potassium: 3.9 mmol/L (ref 3.5–5.2)
Protein, Total: 6.4 g/dL (ref 6.0–8.5)
Sodium: 140 mmol/L (ref 134–144)
non-African American eGFR: 102 mL/min/{1.73_m2} (ref >59)

## 2019-12-23 LAB — URIC ACID: Uric acid: 4.6 mg/dL (ref 2.6–6.2)

## 2019-12-23 LAB — LACTATE DEHYDROGENASE: LDH: 215 IU/L (ref 119–226)

## 2019-12-23 LAB — PROTEIN / CREATININE RATIO, URINE
Creatinine, UR: 133.2 mg/dL
Protein, UR: 15 mg/dL
Protein/Creatinine Ratio: 113 mg/g creat (ref 0–200)

## 2019-12-25 ENCOUNTER — Telehealth (INDEPENDENT_AMBULATORY_CARE_PROVIDER_SITE_OTHER): Payer: Self-pay

## 2019-12-25 NOTE — Telephone Encounter (Signed)
Pt aware norma PIH labs.

## 2019-12-25 NOTE — Progress Notes (Signed)
Please let pt know PIH labs are normal. Thanks.

## 2019-12-29 ENCOUNTER — Ambulatory Visit (INDEPENDENT_AMBULATORY_CARE_PROVIDER_SITE_OTHER): Payer: BLUE CROSS/BLUE SHIELD | Admitting: Obstetrics & Gynecology

## 2019-12-29 VITALS — BP 150/84 | Wt 163.0 lb

## 2019-12-29 DIAGNOSIS — Z3483 Encounter for supervision of other normal pregnancy, third trimester: Secondary | ICD-10-CM

## 2019-12-29 DIAGNOSIS — Z3A38 38 weeks gestation of pregnancy: Secondary | ICD-10-CM

## 2019-12-29 DIAGNOSIS — Z369 Encounter for antenatal screening, unspecified: Secondary | ICD-10-CM

## 2019-12-29 NOTE — Progress Notes (Signed)
Second BP reading on left side: 128/74

## 2019-12-29 NOTE — Progress Notes (Addendum)
Repeat BP sitting was 120/75 with me. Pt denies any HA or RUQ pain. + FM. Reviewed induction protocols for Monday. Cervix is 2 but very posterior.

## 2020-01-01 ENCOUNTER — Encounter: Payer: Self-pay | Admitting: Anesthesiology

## 2020-01-01 ENCOUNTER — Encounter (HOSPITAL_BASED_OUTPATIENT_CLINIC_OR_DEPARTMENT_OTHER): Payer: Self-pay | Admitting: Obstetrics & Gynecology

## 2020-01-01 ENCOUNTER — Observation Stay (HOSPITAL_BASED_OUTPATIENT_CLINIC_OR_DEPARTMENT_OTHER): Payer: BLUE CROSS/BLUE SHIELD

## 2020-01-01 ENCOUNTER — Inpatient Hospital Stay
Admission: RE | Admit: 2020-01-01 | Discharge: 2020-01-02 | DRG: 807 | Disposition: A | Discharge status: Home / Self Care | Payer: BLUE CROSS/BLUE SHIELD | Attending: Obstetrics & Gynecology | Admitting: Obstetrics & Gynecology

## 2020-01-01 DIAGNOSIS — Z3490 Encounter for supervision of normal pregnancy, unspecified, unspecified trimester: Secondary | ICD-10-CM

## 2020-01-01 DIAGNOSIS — Z3A39 39 weeks gestation of pregnancy: Secondary | ICD-10-CM

## 2020-01-01 DIAGNOSIS — Z87891 Personal history of nicotine dependence: Secondary | ICD-10-CM

## 2020-01-01 DIAGNOSIS — O9902 Anemia complicating childbirth: Secondary | ICD-10-CM | POA: Diagnosis present

## 2020-01-01 DIAGNOSIS — D649 Anemia, unspecified: Secondary | ICD-10-CM | POA: Diagnosis present

## 2020-01-01 DIAGNOSIS — O1092 Unspecified pre-existing hypertension complicating childbirth: Principal | ICD-10-CM | POA: Diagnosis present

## 2020-01-01 LAB — CBC AND DIFFERENTIAL
Absolute NRBC: 0 10*3/uL (ref 0.00–0.00)
Basophils Absolute Automated: 0.05 10*3/uL (ref 0.00–0.08)
Basophils Automated: 0.5 %
Eosinophils Absolute Automated: 0.25 10*3/uL (ref 0.00–0.44)
Eosinophils Automated: 2.4 %
Hematocrit: 29.8 % — ABNORMAL LOW (ref 34.7–43.7)
Hgb: 9.3 g/dL — ABNORMAL LOW (ref 11.4–14.8)
Immature Granulocytes Absolute: 0.09 10*3/uL — ABNORMAL HIGH (ref 0.00–0.07)
Immature Granulocytes: 0.9 %
Lymphocytes Absolute Automated: 2.74 10*3/uL (ref 0.42–3.22)
Lymphocytes Automated: 26 %
MCH: 25.9 pg (ref 25.1–33.5)
MCHC: 31.2 g/dL — ABNORMAL LOW (ref 31.5–35.8)
MCV: 83 fL (ref 78.0–96.0)
MPV: 10.2 fL (ref 8.9–12.5)
Monocytes Absolute Automated: 0.88 10*3/uL — ABNORMAL HIGH (ref 0.21–0.85)
Monocytes: 8.3 %
Neutrophils Absolute: 6.53 10*3/uL — ABNORMAL HIGH (ref 1.10–6.33)
Neutrophils: 61.9 %
Nucleated RBC: 0 /100 WBC (ref 0.0–0.0)
Platelets: 250 10*3/uL (ref 142–346)
RBC: 3.59 10*6/uL — ABNORMAL LOW (ref 3.90–5.10)
RDW: 12 % (ref 11–15)
WBC: 10.54 10*3/uL — ABNORMAL HIGH (ref 3.10–9.50)

## 2020-01-01 LAB — TYPE AND SCREEN
AB Screen Gel: NEGATIVE
ABO Rh: A POS

## 2020-01-01 MED ORDER — SODIUM CHLORIDE 0.9 % IV SOLN
6.2500 mg | Freq: Four times a day (QID) | INTRAVENOUS | Status: DC | PRN
Start: 2020-01-01 — End: 2020-01-02

## 2020-01-01 MED ORDER — OXYCODONE HCL 5 MG PO TABS
5.0000 mg | ORAL_TABLET | Freq: Once | ORAL | Status: DC | PRN
Start: 2020-01-01 — End: 2020-01-01

## 2020-01-01 MED ORDER — SOD CITRATE-CITRIC ACID 500-334 MG/5ML PO SOLN
30.0000 mL | Freq: Once | ORAL | Status: DC | PRN
Start: 2020-01-01 — End: 2020-01-01

## 2020-01-01 MED ORDER — TERBUTALINE SULFATE 1 MG/ML IJ SOLN
0.2500 mg | Freq: Once | INTRAMUSCULAR | Status: DC | PRN
Start: 2020-01-01 — End: 2020-01-01

## 2020-01-01 MED ORDER — MAGNESIUM HYDROXIDE 400 MG/5ML PO SUSP
30.0000 mL | Freq: Four times a day (QID) | ORAL | Status: DC | PRN
Start: 2020-01-01 — End: 2020-01-02

## 2020-01-01 MED ORDER — OXYTOCIN-SODIUM CHLORIDE 30-0.9 UT/500ML-% IV SOLN
4.0000 m[IU]/min | INTRAVENOUS | Status: DC | PRN
Start: 2020-01-01 — End: 2020-01-01
  Administered 2020-01-01: 09:00:00 4 m[IU]/min via INTRAVENOUS
  Filled 2020-01-01: qty 1000

## 2020-01-01 MED ORDER — ACETAMINOPHEN 325 MG PO TABS
650.0000 mg | ORAL_TABLET | ORAL | Status: DC | PRN
Start: 2020-01-01 — End: 2020-01-02

## 2020-01-01 MED ORDER — MISOPROSTOL 200 MCG PO TABS
800.0000 ug | ORAL_TABLET | Freq: Once | ORAL | Status: DC | PRN
Start: 2020-01-01 — End: 2020-01-02

## 2020-01-01 MED ORDER — NALOXONE HCL 0.4 MG/ML IJ SOLN (WRAP)
0.2000 mg | INTRAMUSCULAR | Status: DC | PRN
Start: 2020-01-01 — End: 2020-01-01

## 2020-01-01 MED ORDER — BISACODYL 10 MG RE SUPP
10.0000 mg | Freq: Every day | RECTAL | Status: DC | PRN
Start: 2020-01-01 — End: 2020-01-02

## 2020-01-01 MED ORDER — NALOXONE HCL 0.4 MG/ML IJ SOLN (WRAP)
0.2000 mg | INTRAMUSCULAR | Status: DC | PRN
Start: 2020-01-01 — End: 2020-01-02

## 2020-01-01 MED ORDER — PROMETHAZINE HCL 12.5 MG RE SUPP
12.5000 mg | Freq: Four times a day (QID) | RECTAL | Status: DC | PRN
Start: 2020-01-01 — End: 2020-01-02

## 2020-01-01 MED ORDER — ACETAMINOPHEN 325 MG PO TABS
650.0000 mg | ORAL_TABLET | ORAL | Status: DC | PRN
Start: 2020-01-01 — End: 2020-01-01

## 2020-01-01 MED ORDER — LANOLIN EX OINT
TOPICAL_OINTMENT | CUTANEOUS | Status: DC | PRN
Start: 2020-01-01 — End: 2020-01-02

## 2020-01-01 MED ORDER — LACTATED RINGERS IV SOLN
INTRAVENOUS | Status: DC
Start: 2020-01-01 — End: 2020-01-01

## 2020-01-01 MED ORDER — LIDOCAINE HCL (PF) 1 % IJ SOLN
10.0000 mL | INTRAMUSCULAR | Status: DC
Start: 2020-01-01 — End: 2020-01-01

## 2020-01-01 MED ORDER — HYDROCORTISONE 1 % EX OINT
TOPICAL_OINTMENT | Freq: Three times a day (TID) | CUTANEOUS | Status: DC | PRN
Start: 2020-01-01 — End: 2020-01-02

## 2020-01-01 MED ORDER — MISOPROSTOL 200 MCG PO TABS
800.0000 ug | ORAL_TABLET | Freq: Once | ORAL | Status: DC | PRN
Start: 2020-01-01 — End: 2020-01-01

## 2020-01-01 MED ORDER — BUPIVACAINE HCL (PF) 0.25 % IJ SOLN
30.0000 mL | Freq: Once | INTRAMUSCULAR | Status: DC
Start: 2020-01-01 — End: 2020-01-01
  Filled 2020-01-01: qty 30

## 2020-01-01 MED ORDER — ONDANSETRON 4 MG PO TBDP
4.0000 mg | ORAL_TABLET | Freq: Three times a day (TID) | ORAL | Status: DC | PRN
Start: 2020-01-01 — End: 2020-01-02

## 2020-01-01 MED ORDER — BENZOCAINE 20% +/- MENTHOL 0.5% EX AERO (WRAP)
1.0000 | INHALATION_SPRAY | CUTANEOUS | Status: DC | PRN
Start: 2020-01-01 — End: 2020-01-02
  Administered 2020-01-01 – 2020-01-02 (×2): 1 via TOPICAL
  Filled 2020-01-01 (×2): qty 57

## 2020-01-01 MED ORDER — MEASLES, MUMPS & RUBELLA VAC IJ SOLR
0.5000 mL | INTRAMUSCULAR | Status: DC | PRN
Start: 2020-01-01 — End: 2020-01-02

## 2020-01-01 MED ORDER — NIFEDIPINE 10 MG PO CAPS
10.0000 mg | ORAL_CAPSULE | Freq: Once | ORAL | Status: DC | PRN
Start: 2020-01-01 — End: 2020-01-02

## 2020-01-01 MED ORDER — SENNOSIDES-DOCUSATE SODIUM 8.6-50 MG PO TABS
1.0000 | ORAL_TABLET | Freq: Every evening | ORAL | Status: DC | PRN
Start: 2020-01-01 — End: 2020-01-02
  Administered 2020-01-01: 21:00:00 1 via ORAL
  Filled 2020-01-01: qty 1

## 2020-01-01 MED ORDER — NALOXONE HCL 0.4 MG/ML IJ SOLN (WRAP)
0.1000 mg | INTRAMUSCULAR | Status: DC | PRN
Start: 2020-01-01 — End: 2020-01-01

## 2020-01-01 MED ORDER — METOCLOPRAMIDE HCL 5 MG/ML IJ SOLN
10.0000 mg | Freq: Once | INTRAMUSCULAR | Status: DC | PRN
Start: 2020-01-01 — End: 2020-01-01

## 2020-01-01 MED ORDER — FENTANYL-BUPIVACAINE-NACL 0.2-0.125-0.9 MG/100ML-% EP SOLN
EPIDURAL | Status: DC
Start: 2020-01-01 — End: 2020-01-01
  Administered 2020-01-01: 11:00:00 10 mL/h via EPIDURAL
  Filled 2020-01-01: qty 100

## 2020-01-01 MED ORDER — IBUPROFEN 600 MG PO TABS
600.0000 mg | ORAL_TABLET | Freq: Once | ORAL | Status: DC | PRN
Start: 2020-01-01 — End: 2020-01-01
  Administered 2020-01-01: 15:00:00 600 mg via ORAL
  Filled 2020-01-01: qty 1

## 2020-01-01 MED ORDER — PROMETHAZINE HCL 12.5 MG PO TABS
12.5000 mg | ORAL_TABLET | Freq: Four times a day (QID) | ORAL | Status: DC | PRN
Start: 2020-01-01 — End: 2020-01-02

## 2020-01-01 MED ORDER — ELITE-OB 50-1.25 MG PO TABS
1.0000 | ORAL_TABLET | Freq: Every day | ORAL | Status: DC
Start: 2020-01-01 — End: 2020-01-02
  Filled 2020-01-01: qty 1

## 2020-01-01 MED ORDER — OXYTOCIN-SODIUM CHLORIDE 30-0.9 UT/500ML-% IV SOLN
7.5000 [IU]/h | INTRAVENOUS | Status: DC
Start: 2020-01-01 — End: 2020-01-02

## 2020-01-01 MED ORDER — WITCH HAZEL EX PADS (WRAP)
1.0000 | MEDICATED_PAD | CUTANEOUS | Status: DC | PRN
Start: 2020-01-01 — End: 2020-01-02
  Administered 2020-01-01 – 2020-01-02 (×2): 1 via TOPICAL
  Filled 2020-01-01 (×2): qty 40

## 2020-01-01 MED ORDER — OXYTOCIN-SODIUM CHLORIDE 30-0.9 UT/500ML-% IV SOLN
7.5000 [IU]/h | INTRAVENOUS | Status: DC | PRN
Start: 2020-01-02 — End: 2020-01-01
  Administered 2020-01-01: 12:00:00 7.5 [IU]/h via INTRAVENOUS

## 2020-01-01 MED ORDER — AMMONIA AROMATIC IN INHA
1.0000 | Freq: Once | RESPIRATORY_TRACT | Status: DC | PRN
Start: 2020-01-01 — End: 2020-01-02

## 2020-01-01 MED ORDER — LACTATED RINGERS IV BOLUS
1000.0000 mL | Freq: Once | INTRAVENOUS | Status: AC
Start: 2020-01-01 — End: 2020-01-01
  Administered 2020-01-01: 09:00:00 1000 mL via INTRAVENOUS

## 2020-01-01 MED ORDER — IBUPROFEN 600 MG PO TABS
600.0000 mg | ORAL_TABLET | Freq: Four times a day (QID) | ORAL | Status: DC
Start: 2020-01-01 — End: 2020-01-02
  Administered 2020-01-01 – 2020-01-02 (×3): 600 mg via ORAL
  Filled 2020-01-01 (×3): qty 1

## 2020-01-01 MED ORDER — ONDANSETRON HCL 4 MG/2ML IJ SOLN
4.0000 mg | Freq: Three times a day (TID) | INTRAMUSCULAR | Status: DC | PRN
Start: 2020-01-01 — End: 2020-01-02

## 2020-01-01 MED ORDER — NIFEDIPINE 10 MG PO CAPS
20.0000 mg | ORAL_CAPSULE | Freq: Once | ORAL | Status: DC | PRN
Start: 2020-01-01 — End: 2020-01-02

## 2020-01-01 MED ORDER — METHYLERGONOVINE MALEATE 0.2 MG PO TABS
0.2000 mg | ORAL_TABLET | Freq: Four times a day (QID) | ORAL | Status: DC | PRN
Start: 2020-01-01 — End: 2020-01-02

## 2020-01-01 MED ORDER — FENTANYL CITRATE (PF) 50 MCG/ML IJ SOLN (WRAP)
INTRAMUSCULAR | Status: DC | PRN
Start: 2020-01-01 — End: 2020-01-01
  Administered 2020-01-01 (×2): 6 mL via EPIDURAL

## 2020-01-01 MED ORDER — METHYLERGONOVINE MALEATE 0.2 MG/ML IJ SOLN
0.2000 mg | INTRAMUSCULAR | Status: DC | PRN
Start: 2020-01-01 — End: 2020-01-01

## 2020-01-01 MED ORDER — EPHEDRINE SULFATE 50 MG/ML IJ/IV SOLN (WRAP)
10.0000 mg | Freq: Once | Status: DC | PRN
Start: 2020-01-01 — End: 2020-01-01

## 2020-01-01 MED ORDER — ACETAMINOPHEN 650 MG RE SUPP
650.0000 mg | RECTAL | Status: DC | PRN
Start: 2020-01-01 — End: 2020-01-02

## 2020-01-01 MED ORDER — TETANUS-DIPHTH-ACELL PERTUSSIS 5-2.5-18.5 LF-MCG/0.5 IM SUSP
0.5000 mL | INTRAMUSCULAR | Status: DC | PRN
Start: 2020-01-01 — End: 2020-01-02

## 2020-01-01 MED ORDER — METHYLERGONOVINE MALEATE 0.2 MG/ML IJ SOLN
0.2000 mg | Freq: Four times a day (QID) | INTRAMUSCULAR | Status: DC | PRN
Start: 2020-01-01 — End: 2020-01-02

## 2020-01-01 MED ORDER — OXYCODONE HCL 5 MG PO TABS
5.0000 mg | ORAL_TABLET | ORAL | Status: DC | PRN
Start: 2020-01-01 — End: 2020-01-02

## 2020-01-01 MED ORDER — FENTANYL CITRATE (PF) 50 MCG/ML IJ SOLN (WRAP)
100.0000 ug | Freq: Once | INTRAMUSCULAR | Status: DC
Start: 2020-01-01 — End: 2020-01-01
  Filled 2020-01-01: qty 2

## 2020-01-01 MED ORDER — FAMOTIDINE 10 MG/ML IV SOLN (WRAP)
20.0000 mg | Freq: Once | INTRAVENOUS | Status: DC | PRN
Start: 2020-01-01 — End: 2020-01-01

## 2020-01-01 MED ORDER — ACETAMINOPHEN 650 MG RE SUPP
650.0000 mg | RECTAL | Status: DC | PRN
Start: 2020-01-01 — End: 2020-01-01

## 2020-01-01 NOTE — Plan of Care (Signed)
Patient stable and continues to progress. Discussed plan for the shift and safety measures - patient verbalized understanding. Patient is breastfeeding infant, reinforced education on frequency of feedings and offered assistance with feeds as desired by patient. Patient verbalized understanding of teaching.   Will continue to monitor and provide support.    Problem: Vaginal/Cesarean Delivery  Goal: Maternal Status within defined parameters  Outcome: Progressing  Flowsheets (Taken 01/01/2020 2052)  Maternal status with defined parameters:   Monitor/assess vital signs   Perform physical assessment per phase of care  Goal: Perineum will be clean, dry, and intact and without discharge or hematoma  Outcome: Progressing  Flowsheets (Taken 01/01/2020 2052)  Perineum will be clean, dry, and intact and without discharge or hematoma:   Place cold pack on perineum   Sitz bath PRN as ordered by LIP   Monitor wound/incision for signs of infections   Assess for hemorrhoids and provide interventions     Problem: Pain  Goal: Pain at adequate level as identified by patient  Outcome: Progressing  Flowsheets (Taken 01/01/2020 2052)  Pain at adequate level as identified by patient:   Identify patient comfort function goal   Assess pain on admission, during daily assessment and/or before any "as needed" intervention(s)   Reassess pain within 30-60 minutes of any procedure/intervention, per Pain Assessment, Intervention, Reassessment (AIR) Cycle   Evaluate if patient comfort function goal is met   Evaluate patient's satisfaction with pain management progress   Offer non-pharmacological pain management interventions     Problem: Psychosocial and Spiritual Needs  Goal: Demonstrates ability to cope with hospitalization/illness  Outcome: Progressing  Flowsheets (Taken 01/01/2020 2052)  Demonstrates ability to cope with hospitalizations/illness:   Encourage verbalization of feelings/concerns/expectations   Encourage patient to set small goals  for self   Include patient/ patient care companion in decisions   Provide quiet environment   Encourage participation in diversional activity   Reinforce positive adaptation of new coping behaviors   Assist patient to identify own strengths and abilities

## 2020-01-01 NOTE — Plan of Care (Signed)
Rhonda [name] is a 29 y/o G3P2 presenting at [redacted]w[redacted]d for an elective IOL. She is Intact and 3 cm dilated, 50% effaced, and -2 station. Reviewed and discussed POC goal; patient agrees to POC goals and all questions answered.  VS within normal limits.  EFM Category 1 tracing. GBS neg. Will continue to monitor.

## 2020-01-01 NOTE — Anesthesia Procedure Notes (Signed)
Epidural    Patient location during procedure: L&D  Reason for block: Labor or C-section  Block at Surgeon's request: Yes    End time: 01/01/2020 10:31 AM    Staffing  Performed: anesthesiologist   Anesthesiologist: Tommy Rainwater, MD    Pre-procedure Checklist   Completed: patient identified, pre-op evaluation, timeout performed, risks and benefits discussed and anesthesia consent given      Epidural  Patient monitoring: NIBP and Pulse oximetry    Premedication: No and Meaningful Contact Maintained  Patient position: sitting    Skin Local: bupivicaine 0.25%    Attempts  Number of attempts: 1                    Successful attempt  Interspace: L3-4  Approach: midline    Needle type: Touhy needle   Needle gauge: 17  Injection technique: LOR air    CSF Return: No   Blood Return: No  Paresthesia Pain: no and No    Needle Placement  Needle type: Touhy needle   Needle gauge: 17  Injection technique: LOR air  CSF Return: No  Blood Return: No          Paresthesia Pain: no and No    Catheter Placement   Catheter type: end hole  Catheter size: 19 G  Catheter at skin depth: 13 cm  CSF Return: No  Blood Return: No  Test Dose:negative    Incremental injection: yes    No Catheter IV/SA Signs or Symptoms    Assessment   Patient tolerated procedure well: Yes  Block Outcome: patient tolerated procedure well, no complications and pain improved

## 2020-01-01 NOTE — Anesthesia Preprocedure Evaluation (Signed)
Anesthesia Evaluation    AIRWAY    Mallampati: II    TM distance: >3 FB  Neck ROM: full  Mouth Opening:full   CARDIOVASCULAR    cardiovascular exam normal       DENTAL    no notable dental hx     PULMONARY    pulmonary exam normal     OTHER FINDINGS    G3P2@ 39wks  Chronic HTN  Plt 250                Relevant Problems   NEURO/PSYCH   (+) History of gestational hypertension               Anesthesia Plan    ASA 2     epidural                     Detailed anesthesia plan: epidural        Post op pain management: per surgeon    informed consent obtained      pertinent labs reviewed             Signed by: Tommy Rainwater, MD 01/01/20 10:18 AM

## 2020-01-01 NOTE — H&P (Signed)
[facility] DELIVERY ADMISSION H&P        No chief complaint on file.      Subjective:  Rhonda [name] is a 29 y.o.  G24P2002 female with a gestation age of [redacted]w[redacted]d and Estimated Date of Delivery: 01/06/20 who presents to the hospital with induction of labor.  Her current obstetrical history is significant for anemia.  Patient reports no complaints.       OB History   Gravida Para Term Preterm AB Living   3 2 2  0 0 2   SAB TAB Ectopic Multiple Live Births   0 0 0 0 2      # Outcome Date GA Lbr Len/2nd Weight Sex Delivery Anes PTL Lv   3 Current            2 Term 09/25/16 [redacted]w[redacted]d / 00:12 3.75 kg (8 lb 4.3 oz) F Vag-Spont EPI N LIV   1 Term 10/03/13    F Vag-Spont EPI  LIV     Past Medical History:   Diagnosis Date    Anemia     Anxiety     Chlamydia 04/16/2016    treated; tested negative 06/12/16    Hypertension     chronic but not on meds and gestational with first pregnancy     Past Surgical History:   Procedure Laterality Date    ENDOSCOPY UPPER GI      WISDOM TOOTH EXTRACTION       No Known Allergies  Social History     Tobacco Use    Smoking status: Former Smoker     Packs/day: 0.25     Types: Cigarettes    Smokeless tobacco: Never Used   Haematologist Use: Former   Substance Use Topics    Alcohol use: Not Currently     Comment: 4-5 WEEKLY    Drug use: Yes     Comment: THC     Medications Prior to Admission   Medication Sig    ferrous sulfate 300 (60 Fe) MG/5ML syrup Take by mouth daily    Prenatal Vit-Fe Fumarate-FA (PRENATAL VITAMIN PO) Take by mouth    BABY ASPIRIN PO Take by mouth       Patient Active Problem List   Diagnosis    History of gestational hypertension    Encounter for induction of labor         Objective:    Vital Signs:  Temp:  [97.8 F (36.6 C)] 97.8 F (36.6 C)  Heart Rate:  [79] 79  Resp Rate:  [20] 20  BP: (114)/(75) 114/75      Review of Systems:   General appearance - alert, well appearing, and in no distress and oriented to person, place, and time   Mental status - alert, oriented to person, place, and time  Chest - clear to auscultation, no wheezes, rales or rhonchi, symmetric air entry  Heart - normal rate and regular rhythm  Abdomen - Gravid, soft, nontender, nondistended, no masses or organomegaly,   Extremities - peripheral pulses normal, no pedal edema, no clubbing or cyanosis    Cervical Exam:  Dilation: 4  Effacement (%): 80  Station: -3  Presentation: Vertex  OB Examiner: Dannilynn Gallina CNM    FHT: Baseline Rate: 130 BPM  TOCO: Contraction Frequency: 2-4/ irritability       Labs:     Results     Procedure Component Value Units Date/Time    CBC and differential [  [id number]]  (Abnormal) Collected: 01/01/20 0725    Specimen: Blood Updated: 01/01/20 0750     WBC 10.54 x10 3/uL      Hgb 9.3 g/dL      Hematocrit 40.9 %      Platelets 250 x10 3/uL      RBC 3.59 x10 6/uL      MCV 83.0 fL      MCH 25.9 pg      MCHC 31.2 g/dL      RDW 12 %      MPV 10.2 fL      Neutrophils 61.9 %      Lymphocytes Automated 26.0 %      Monocytes 8.3 %      Eosinophils Automated 2.4 %      Basophils Automated 0.5 %      Immature Granulocytes 0.9 %      Nucleated RBC 0.0 /100 WBC      Neutrophils Absolute 6.53 x10 3/uL      Lymphocytes Absolute Automated 2.74 x10 3/uL      Monocytes Absolute Automated 0.88 x10 3/uL      Eosinophils Absolute Automated 0.25 x10 3/uL      Basophils Absolute Automated 0.05 x10 3/uL      Immature Granulocytes Absolute 0.09 x10 3/uL      Absolute NRBC 0.00 x10 3/uL     Type and Screen [[id number]] Collected: 01/01/20 0725    Specimen: Blood Updated: 01/01/20 0735          Prenatal Labs  Lab Results   Component Value Date    ABORH A POS 09/24/2016    HEPBSAG Negative 06/14/2019    GBS Negative 12/13/2019    RPR Non Reactive 06/14/2019    RUBELLAABIGG 1.15 06/14/2019    RUBELLAABIGG Immune 04/16/2016    HCT 29.8 (L) 01/01/2020    HGB 9.3 (L) 01/01/2020    HGB 9.8 (L) 12/22/2019    PLT 250 01/01/2020    PLT 316 12/22/2019        Assessment  Patient is a 29 y.o.,  [id number] [redacted]w[redacted]d for IOL. Pregnancy c/b anemia    Plan:  Admit to L&D  Pitocin/AROM  Epidural PRN        Risks, benefits, alternatives and possible complications have been discussed in detail with the patient.  Pre-admission, admission, and post admission procedures and expectations were discussed in detail.  All questions answered, all appropriate consents will be signed at the Hospital.     Heather Roberts, CNM    Dr. Murrell Converse is supervising physician and aware of patient status and plan of care.

## 2020-01-01 NOTE — Plan of Care (Signed)
Problem: Vaginal/Cesarean Delivery  Goal: Maternal Status within defined parameters  Outcome: Progressing  Goal: Evidence of Fetal Well Being  Outcome: Progressing  Goal: Free from Maternal/Fetal Infection  Outcome: Progressing  Goal: Intrapartum management of pain/discomfort  Outcome: Progressing  Goal: Postpartum management of pain/discomfort  Outcome: Progressing  Flowsheets (Taken 01/01/2020 1509)  Postpartum management of pain/discomfort:   Assess pain using a consistent, developmental/age appropriate pain scale   Assess pain level before and following intervention   Include patient/patient care companion in decisions related to pain management   Monitor for post anesthesia issues related to pain management   Offer non-pharmacologic pain management interventions  Goal: Breasts are soft with nipple integrity intact  Outcome: Progressing  Goal: Gastrointestinal/Urinary management  Outcome: Progressing  Goal: Uterine management  Outcome: Progressing  Flowsheets (Taken 01/01/2020 1509)  Uterine management:   Assess fundus and notify LIP if not firm, midline, or at or below the umbilicus, or if abdomen is abnormally distended   Assess for hemorrhage risk using appropriate screening tool  Goal: Perineum will be clean, dry, and intact and without discharge or hematoma  Outcome: Progressing     Problem: Safety  Goal: Patient will be free from injury during hospitalization  Outcome: Progressing  Goal: Patient will be free from infection during hospitalization  Outcome: Progressing     Problem: Pain  Goal: Pain at adequate level as identified by patient  Outcome: Progressing     Problem: Side Effects from Pain Analgesia  Goal: Patient will experience minimal side effects of analgesic therapy  Outcome: Progressing     Problem: Discharge Barriers  Goal: Patient will be discharged home or other facility with appropriate resources  Outcome: Progressing     Problem: Psychosocial and Spiritual Needs  Goal: Demonstrates  ability to cope with hospitalization/illness  Outcome: Progressing     Problem: Moderate/High Fall Risk Score >5  Goal: Patient will remain free of falls  Outcome: Progressing     Problem: Compromised Tissue integrity  Goal: Damaged tissue is healing and protected  Outcome: Progressing  Goal: Nutritional status is improving  Outcome: Progressing       Admitted patient and infant to [facility] 6, FOB present. Bands reviewed and confirmed. Oriented to room, call bell and emergency cord. Infant safety and falls teaching completed. Pt verbalized understanding and signed form. Pt VSS. Lochia WNL. Pain controlled with ibuprofen. Breastfeeding initiated; discussed skin to skin, frequency of feeds, and how to wake a sleepy baby. All questions and concerns addressed. Safety rounding continued.

## 2020-01-02 NOTE — Lactation Note (Signed)
Follow up LC.   Baby is sleepy, required tactile stimulation to wake up .   3 ml colostrum given via syringe, after that woke up and latched well with wide open mouth. Suck- swallow-breathe pattern observed.   If baby too sleepy feed EBM with syringe or bottle.   Reviewed supply/demmand.   Follow up PRN

## 2020-01-02 NOTE — Progress Notes (Signed)
Post-Partum Day #1    S:  Doing well.  Tolerating PO.  Ambulating without difficulty.  Pain controlled.  Lochia decreasing.  +Flatus.      O:   VS: Patient Vitals for the past 24 hrs:   BP Temp Temp src Pulse Resp SpO2   01/02/20 0536 119/82 97.9 F (36.6 C) Oral 75 18 99 %   01/02/20 0201 129/88 -- -- 75 18 98 %   01/01/20 2028 116/81 97.9 F (36.6 C) Oral 68 18 97 %   01/01/20 1646 118/78 97.6 F (36.4 C) Oral 81 18 100 %   01/01/20 1433 130/86 97.7 F (36.5 C) Oral 71 18 100 %   01/01/20 1330 120/75 -- -- (!) 57 -- --   01/01/20 1322 121/76 -- -- 63 -- --   01/01/20 1300 122/73 -- -- (!) 58 -- --   01/01/20 1252 116/69 -- -- 70 -- --   01/01/20 1245 -- 97.6 F (36.4 C) Oral -- -- --   01/01/20 1237 120/62 -- -- 80 -- --   01/01/20 1215 120/66 -- -- 73 -- --   01/01/20 1145 119/87 -- -- 73 -- --   01/01/20 1130 -- 97.9 F (36.6 C) Oral -- -- --   01/01/20 1115 116/69 -- -- 63 -- --   01/01/20 1043 111/71 -- -- 78 -- --   01/01/20 1040 108/72 -- -- 82 -- --   01/01/20 1036 124/76 -- -- 77 -- --   01/01/20 1034 124/72 -- -- 67 -- --   01/01/20 1030 130/81 -- -- 75 -- --      General: no acute distress   CVS: RRR   Pulm: Clear, bilaterally   Abd: soft, non-tender, fundus firm below the umbilicus   Ext: No edema, cords or tenderness     A/P:  Rhonda [name] is a 29 y.o. [id number] Postpartum Day #1 S/P SVD.   - routine post-partum care  - circumcision to be done today   - anticipate discharge to home today per pt request (ok to change [place] to 10/13 if prefers to stay 1 additional night)

## 2020-01-02 NOTE — Discharge Instr - AVS First Page (Addendum)
Getting Ready to Go Home: Mother and Infant Discharge Guide  Open the camera on your cell phone and point at the image. Link will appear on your screen.     Discharge Booklet website: [email or web address]      Resources  Postpartum Support [facility]: postpartumva.org   [phone]  mychart:  [email or web address]  Carseat: carseat.org, [email or web address], seatcheck.org,   New Moms Groups:  [email or web address]  Breastfeeding Support: [phone]

## 2020-01-02 NOTE — Discharge Summary (Signed)
This patient had an uncomplicated vaginal delivery on 10/11 with an uneventful postpartum course. She is ambulating without difficulty and tolerating a regular diet. Her laceration is intact and healing well. She has been given a discharge instruction sheet and will call the office to make a six week postpartum exam appointment.

## 2020-01-02 NOTE — Progress Notes (Signed)
Postpartum discharge instructions and paperwork completed and signed. Patient stable, VSS, feeding newborn via breast only, OOB independently. Patient verbalized understanding of all teaching and expressed confidence in caring for herself and newborn. FOB present. No further questions or concerns

## 2020-01-05 ENCOUNTER — Ambulatory Visit (INDEPENDENT_AMBULATORY_CARE_PROVIDER_SITE_OTHER): Payer: BLUE CROSS/BLUE SHIELD | Admitting: Obstetrics & Gynecology

## 2020-01-17 ENCOUNTER — Telehealth (INDEPENDENT_AMBULATORY_CARE_PROVIDER_SITE_OTHER): Payer: Self-pay

## 2020-01-17 NOTE — Telephone Encounter (Signed)
Called pt for PP check. LM for her to call the office with any questions or concerns.

## 2020-01-22 ENCOUNTER — Telehealth (INDEPENDENT_AMBULATORY_CARE_PROVIDER_SITE_OTHER): Payer: Self-pay

## 2020-01-22 NOTE — Telephone Encounter (Signed)
Pt called, 3 weeks ppar  Started experiencing vertigo after 1 week ppar  Checked her bp today and it was 110/73  Pt states she had some bp issues during pregnancy  She said she has it every day now, it comes and goes in waves of 3-4 seconds  Pt has 2 other children  Pt states she is eating and drinking enough  Pt states she is not doing too much, does not feel an ear or sinus pain.  Explained I didn't think it was a symptom of ppar, but would check with Lurena Joiner, NP and see what her thoughts were.

## 2020-01-22 NOTE — Telephone Encounter (Signed)
I would recommend she see her PCP. Maybe due to some fluid in the inner ear.

## 2020-01-22 NOTE — Telephone Encounter (Signed)
Called pt and gave her Rhonda Joiner, NP 's recommendation

## 2020-02-22 ENCOUNTER — Encounter (INDEPENDENT_AMBULATORY_CARE_PROVIDER_SITE_OTHER): Payer: Self-pay | Admitting: Advanced Practice Midwife

## 2020-02-22 ENCOUNTER — Ambulatory Visit (INDEPENDENT_AMBULATORY_CARE_PROVIDER_SITE_OTHER): Payer: BLUE CROSS/BLUE SHIELD | Admitting: Advanced Practice Midwife

## 2020-02-22 NOTE — Progress Notes (Signed)
Subjective:    Rhonda [name] is a 29 y.o. [phone] who is approximately 6 weeks post-partum from a  vaginal delivery by Stanton Kidney, she had a vaginal laceration.  She delivered a female infant weighing 8 lbs 8 oz.  Baby is doing well.  Delivery complications none.     Condition: Doing well post delivery.  Baby boy, named Bodie  Breastfeeding: Yes, but it is causing her anxiety so she is considering stopping.     Postpartum depression: EPDS score 7  Complaints No- she looked in the mirror a few weeks ago and saw something bulging out of her vagina and was concerned but it has gone away.  Lochia none.        Objective:    Vitals:    02/22/20 0942   BP: 114/70     Abdomen: soft, nontender, nondistended, no masses or organomegaly. No rectus diastasis.   Perineum/Anus: No Hemorrhoids, perineum well healed.   Vaginal: normal mucosa without prolapse or lesions and normal rugae.  Uterus: normal size, well involuted, firm, non-tender  Adnexae: no masses palpable and nontender      Assessment:  Normal postpartum exam.    Plan:  Contraception options discussed including progesterone only methods, LARCs, condoms. Patient desires condoms for now.   Discussed what she likely saw was the posterior vaginal wall prolapsing which can happen after delivery as healing occurs. If she has weakness or it begins to happen more frequently then she may benefit from pelvic floor PT-she will call for a referral if needed.   Patient may resume normal activities.  Follow up: PRN or 6 months for annual exam.

## 2020-02-27 ENCOUNTER — Ambulatory Visit (INDEPENDENT_AMBULATORY_CARE_PROVIDER_SITE_OTHER): Payer: BLUE CROSS/BLUE SHIELD | Admitting: Obstetrics & Gynecology

## 2020-08-05 ENCOUNTER — Telehealth (INDEPENDENT_AMBULATORY_CARE_PROVIDER_SITE_OTHER): Payer: Self-pay

## 2020-08-05 ENCOUNTER — Ambulatory Visit (INDEPENDENT_AMBULATORY_CARE_PROVIDER_SITE_OTHER): Payer: No Typology Code available for payment source

## 2020-08-05 DIAGNOSIS — Z3201 Encounter for pregnancy test, result positive: Secondary | ICD-10-CM

## 2020-08-05 NOTE — Progress Notes (Signed)
Patient returned to office for beta hcg. Labs ordered and drawn per Dr. Lindberg. Awaiting results

## 2020-08-05 NOTE — Telephone Encounter (Signed)
Pt calling, she delivered in August and got 2 + upt today 'out of nowhere'  She is still breastfeeding, has not had periods yet.  She is shocked. They wanted a fourth but weren't quite ready, they were planning to start trying at the end of the summer.  She isn't sure when she would have gotten pregnant, they were not using protection but they were using the 'pull out' method.  She will come in for lab work today to see how far along she is.

## 2020-08-06 ENCOUNTER — Telehealth (INDEPENDENT_AMBULATORY_CARE_PROVIDER_SITE_OTHER): Payer: Self-pay

## 2020-08-06 LAB — HCG QUANTITATIVE: human chorionic gonadotropin (hCG), Beta Chain, Quant., S: 6049 m[IU]/mL

## 2020-08-06 NOTE — Telephone Encounter (Signed)
-----   Message from Doristine Mango, DO sent at 08/06/2020  8:49 AM EDT -----  She's perhaps 5-[redacted] weeks pregnant. I would recommend a COP appt in about 10-14 days.

## 2020-08-06 NOTE — Telephone Encounter (Signed)
Spoke with pt to let her know.  Chrissy will give her a call to get COP scheduled.

## 2020-08-21 ENCOUNTER — Encounter (INDEPENDENT_AMBULATORY_CARE_PROVIDER_SITE_OTHER): Payer: Self-pay

## 2020-08-21 ENCOUNTER — Encounter (INDEPENDENT_AMBULATORY_CARE_PROVIDER_SITE_OTHER): Payer: Self-pay | Admitting: Advanced Practice Midwife

## 2020-08-21 ENCOUNTER — Ambulatory Visit (INDEPENDENT_AMBULATORY_CARE_PROVIDER_SITE_OTHER): Payer: No Typology Code available for payment source | Admitting: Advanced Practice Midwife

## 2020-08-21 VITALS — BP 116/74 | Ht 66.0 in | Wt 148.0 lb

## 2020-08-21 DIAGNOSIS — Z3201 Encounter for pregnancy test, result positive: Secondary | ICD-10-CM

## 2020-08-21 DIAGNOSIS — N912 Amenorrhea, unspecified: Secondary | ICD-10-CM

## 2020-08-21 NOTE — Progress Notes (Signed)
S:  Rhonda [name] is a [phone] who is a established patient in our practice, she presents to the office today with amenorrhea. She had a positive home pregnancy test.  Patient's last menstrual period was 06/18/2020. Based off LMP she is 9 weeks 1 days.  Her menstrual cycles are irregular and she is still breastfeeding. Her obstetrical history is significant for SVD x 3 ([place], 2018 at [facility], 12/2019 with Becca Norene Oliveri). First pregnancy c/b gestational hypertension and was induced at 38 weeks. No BP issues with last pregnancy. Current complaints include nausea. Pt is moving to [place] but will likely stay with [place] for the prenatal care and plans to deliver here as well.     The following portions of the patient's history were reviewed and updated as needed: allergies, current medications, past medical/surgical history, family history, social history and problem list.     O:  .  Vitals:    08/21/20 0940   BP: 116/74   Weight: 67.1 kg (148 lb)   Height: 1.676 m (5\' 6" )       General: NAD, A&A x 3, cooperative, appears stated age.     Sono today in the office showed SIUP measuring 7 wks 6 days. S<D by 9 days. FHR 171.       A:  Viable IUP at 7 wks 6 days  EDD 04/03/2021  Short pregnancy interval    P:  Discussed prenatal vitamins  Provided and reviewed prenatal booklet.   Follow up in 3 weeks for first initial prenatal visit which will include full history and physical and prenatal labs.

## 2020-08-23 ENCOUNTER — Encounter (INDEPENDENT_AMBULATORY_CARE_PROVIDER_SITE_OTHER): Payer: Self-pay | Admitting: Obstetrics & Gynecology

## 2020-08-26 ENCOUNTER — Encounter (INDEPENDENT_AMBULATORY_CARE_PROVIDER_SITE_OTHER): Payer: Self-pay | Admitting: Advanced Practice Midwife

## 2020-09-05 ENCOUNTER — Encounter (INDEPENDENT_AMBULATORY_CARE_PROVIDER_SITE_OTHER): Payer: Self-pay | Admitting: Advanced Practice Midwife

## 2020-09-05 ENCOUNTER — Ambulatory Visit (INDEPENDENT_AMBULATORY_CARE_PROVIDER_SITE_OTHER): Payer: No Typology Code available for payment source | Admitting: Advanced Practice Midwife

## 2020-09-05 VITALS — BP 120/80 | Wt 148.0 lb

## 2020-09-05 DIAGNOSIS — Z3A10 10 weeks gestation of pregnancy: Secondary | ICD-10-CM

## 2020-09-05 DIAGNOSIS — Z3491 Encounter for supervision of normal pregnancy, unspecified, first trimester: Secondary | ICD-10-CM

## 2020-09-05 DIAGNOSIS — Z369 Encounter for antenatal screening, unspecified: Secondary | ICD-10-CM

## 2020-09-05 NOTE — Progress Notes (Signed)
Subjective  This is the initial OB visit for Rhonda [name] she is a [id number] who is an established patient in our practice.  She is at [redacted]w[redacted]d  per early dating sono, there was a 9 day S<D discrepancy from her LMP.    Estimated Date of Delivery: 04/03/21. Patient's last menstrual period was 06/18/2020.. Menstrual cycles are irregular, she is still nursing.  Her obstetrical history is significant for: SVD x 3 (2015[place], 2018 [facility], 2021 with Rhonda [name]). First pregnancy c/b gestational hypertension-induced at 38 weeks. No BP issues with last pregnancy.  Relationship status:Married. Current complaints include Nausea that is worse at night. She is moving to [place] but is planning on staying with [place] for the pregnancy as she will be up frequently to stay with family.     The following portions of the patient's history were reviewed and updated as needed: allergies, current medications, past family history, past medical history, past surgical history, social history and problem list.    Objective  Physical exam as noted in chart.    Assessment:   Viable IUP at [redacted]w[redacted]d      Plan:  Initial Ob labs drawn,  Additions noted in orders.  Pap Smear current  Discussed genetic testing as appropriate (CF, SMA, Fragile X, Tay-Sachs)  CF/SMA/Fragile X-declined   Discussed prenatal screenings and diagnostic tests options reviewed (NT, CVS, Amniocentesis, free cell DNA testing): NT ordered. NIPT today  Questions answered   Prenatal booklet provided and reviewed as appropriate   AFP discussed as appropriate,will draw between 15-18 weeks if pt requests  Referrals - [facility] for NT  Follow up in 5 weeks   Recommend bASA 162 mg qhs starting at 12 weeks given hx of PIH in first pregnancy.

## 2020-09-08 LAB — OB PANEL: CBCD,UA/CX, ABO/RH/AB SCREEN, RPR-RFX, HIV, HBSAG, HCV, RUB
AB Screen Gel: NEGATIVE
Baso(Absolute): 0 10*3/uL (ref 0.0–0.2)
Basos: 0 %
Bilirubin, UA: NEGATIVE
Blood, UA: NEGATIVE
Eos: 1 %
Eosinophils Absolute: 0.1 10*3/uL (ref 0.0–0.4)
Glucose, UA: NEGATIVE
HCV AB: <0.1 s/co ratio (ref 0.0–0.9)
HIV Screen 4th Generation wRfx: NONREACTIVE
Hematocrit: 39.2 % (ref 34.0–46.6)
Hemoglobin: 12.5 g/dL (ref 11.1–15.9)
Hepatitis B Surface Antigen: NEGATIVE
Immature Granulocytes Absolute: 0 10*3/uL (ref 0.0–0.1)
Immature Granulocytes: 0 %
Ketones UA: NEGATIVE
Leukocyte Esterase, UA: NEGATIVE
Lymphocytes Absolute: 2.1 10*3/uL (ref 0.7–3.1)
Lymphocytes: 23 %
MCH: 28.3 pg (ref 26.6–33.0)
MCHC: 31.9 g/dL (ref 31.5–35.7)
MCV: 89 fL (ref 79–97)
Monocytes Absolute: 0.6 10*3/uL (ref 0.1–0.9)
Monocytes: 7 %
Neutrophils Absolute: 6.6 10*3/uL (ref 1.4–7.0)
Neutrophils: 69 %
Nitrite, UA: NEGATIVE
Platelets: 304 10*3/uL (ref 150–450)
Protein, UA: NEGATIVE
RBC: 4.42 x10E6/uL (ref 3.77–5.28)
RDW: 11.6 % — ABNORMAL LOW (ref 11.7–15.4)
RPR: NONREACTIVE
Result 1:: NO GROWTH
Rh Factor: POSITIVE
Rubella AB, IgG: 1 index (ref >0.99)
Urine Specific Gravity: 1.024 (ref 1.005–1.030)
Urobilinogen, Ur: 0.2 mg/dL (ref 0.2–1.0)
WBC: 9.5 10*3/uL (ref 3.4–10.8)
pH, UA: 8 — ABNORMAL HIGH (ref 5.0–7.5)

## 2020-09-08 LAB — INTERPRETATION:

## 2020-09-09 ENCOUNTER — Encounter (INDEPENDENT_AMBULATORY_CARE_PROVIDER_SITE_OTHER): Payer: Self-pay

## 2020-09-09 LAB — MATERNIT21 PLUS CORE AND SEX CHROMOSOME ANEUPLOIDIES
Fetal Fraction: 5
MaterniT Genome Result: NEGATIVE
Monosomy X (Turner Syndrome): NOT DETECTED
Trisomy 13 (T13): NEGATIVE
Trisomy 18 (T18): NEGATIVE
Trisomy 21 (T21): NEGATIVE
XXX (Triple X Syndrome): NOT DETECTED
XXY (Klinefelter Syndrome): NOT DETECTED
XYY (Jacobs Syndrome): NOT DETECTED

## 2020-09-09 NOTE — Progress Notes (Signed)
NIPT screen negative and if she would like to know the gender please let her know.

## 2020-09-09 NOTE — Progress Notes (Signed)
Normal prenatal labs. Pt notified via [facility]

## 2020-09-10 ENCOUNTER — Other Ambulatory Visit (INDEPENDENT_AMBULATORY_CARE_PROVIDER_SITE_OTHER): Payer: Self-pay | Admitting: Advanced Practice Midwife

## 2020-09-10 DIAGNOSIS — Z3A10 10 weeks gestation of pregnancy: Secondary | ICD-10-CM

## 2020-09-10 DIAGNOSIS — Z3491 Encounter for supervision of normal pregnancy, unspecified, first trimester: Secondary | ICD-10-CM

## 2020-09-10 DIAGNOSIS — Z3682 Encounter for antenatal screening for nuchal translucency: Secondary | ICD-10-CM

## 2020-09-10 DIAGNOSIS — Z369 Encounter for antenatal screening, unspecified: Secondary | ICD-10-CM

## 2020-09-30 ENCOUNTER — Inpatient Hospital Stay
Admission: RE | Admit: 2020-09-30 | Discharge: 2020-09-30 | Disposition: A | Discharge status: Home / Self Care | Payer: No Typology Code available for payment source | Source: Ambulatory Visit | Attending: Advanced Practice Midwife | Admitting: Advanced Practice Midwife

## 2020-09-30 DIAGNOSIS — Z3682 Encounter for antenatal screening for nuchal translucency: Secondary | ICD-10-CM | POA: Insufficient documentation

## 2020-09-30 DIAGNOSIS — O09891 Supervision of other high risk pregnancies, first trimester: Secondary | ICD-10-CM | POA: Insufficient documentation

## 2020-09-30 DIAGNOSIS — Z3A10 10 weeks gestation of pregnancy: Secondary | ICD-10-CM

## 2020-09-30 DIAGNOSIS — Z3A13 13 weeks gestation of pregnancy: Secondary | ICD-10-CM | POA: Insufficient documentation

## 2020-09-30 DIAGNOSIS — Z369 Encounter for antenatal screening, unspecified: Secondary | ICD-10-CM

## 2020-09-30 DIAGNOSIS — Z3491 Encounter for supervision of normal pregnancy, unspecified, first trimester: Secondary | ICD-10-CM

## 2020-09-30 DIAGNOSIS — O09291 Supervision of pregnancy with other poor reproductive or obstetric history, first trimester: Secondary | ICD-10-CM | POA: Insufficient documentation

## 2020-10-01 ENCOUNTER — Other Ambulatory Visit (INDEPENDENT_AMBULATORY_CARE_PROVIDER_SITE_OTHER): Payer: Self-pay | Admitting: Obstetrics & Gynecology

## 2020-10-01 DIAGNOSIS — Z369 Encounter for antenatal screening, unspecified: Secondary | ICD-10-CM

## 2020-10-07 NOTE — Progress Notes (Signed)
Normal NT sono at [facility].

## 2020-10-29 LAB — OB RESULTS CONSOLE ANTIBODY SCREEN: Antibody Screen: NEGATIVE

## 2020-10-29 LAB — OB RESULTS CONSOLE GC/CHLAMYDIA
Chlamydia: NEGATIVE
Gonorrhea: NEGATIVE

## 2020-10-29 LAB — OB RESULTS CONSOLE RUBELLA ANTIBODY, IGM: Rubella: IMMUNE

## 2020-10-29 LAB — OB RESULTS CONSOLE HIV ANTIBODY (ROUTINE TESTING): HIV: NONREACTIVE

## 2020-10-29 LAB — OB RESULTS CONSOLE ABO/RH: RH Type: POSITIVE

## 2020-10-29 LAB — OB RESULTS CONSOLE HEPATITIS B SURFACE ANTIGEN: Hepatitis B Surface Ag: NEGATIVE

## 2020-10-29 LAB — OB RESULTS CONSOLE RPR: RPR: NONREACTIVE

## 2020-10-29 LAB — HEPATITIS C ANTIBODY: HCV Ab: NEGATIVE

## 2020-11-06 ENCOUNTER — Inpatient Hospital Stay (HOSPITAL_COMMUNITY): Admission: AD | Admit: 2020-11-06 | Payer: 59 | Source: Home / Self Care | Admitting: Obstetrics and Gynecology

## 2020-11-18 ENCOUNTER — Ambulatory Visit (HOSPITAL_BASED_OUTPATIENT_CLINIC_OR_DEPARTMENT_OTHER): Admission: RE | Admit: 2020-11-18 | Payer: No Typology Code available for payment source | Source: Ambulatory Visit

## 2020-11-19 ENCOUNTER — Ambulatory Visit (HOSPITAL_BASED_OUTPATIENT_CLINIC_OR_DEPARTMENT_OTHER): Payer: No Typology Code available for payment source

## 2020-12-19 ENCOUNTER — Other Ambulatory Visit: Payer: Self-pay | Admitting: Obstetrics and Gynecology

## 2020-12-19 DIAGNOSIS — O0992 Supervision of high risk pregnancy, unspecified, second trimester: Secondary | ICD-10-CM

## 2020-12-25 ENCOUNTER — Encounter: Payer: Self-pay | Admitting: *Deleted

## 2020-12-30 ENCOUNTER — Ambulatory Visit: Payer: 59 | Attending: Obstetrics and Gynecology

## 2020-12-30 ENCOUNTER — Other Ambulatory Visit: Payer: Self-pay

## 2020-12-30 ENCOUNTER — Ambulatory Visit: Payer: 59 | Admitting: *Deleted

## 2020-12-30 ENCOUNTER — Other Ambulatory Visit: Payer: Self-pay | Admitting: *Deleted

## 2020-12-30 VITALS — BP 133/72 | HR 82 | Ht 66.0 in

## 2020-12-30 DIAGNOSIS — O0992 Supervision of high risk pregnancy, unspecified, second trimester: Secondary | ICD-10-CM

## 2020-12-30 DIAGNOSIS — O1493 Unspecified pre-eclampsia, third trimester: Secondary | ICD-10-CM

## 2020-12-30 DIAGNOSIS — O3663X1 Maternal care for excessive fetal growth, third trimester, fetus 1: Secondary | ICD-10-CM

## 2021-01-01 ENCOUNTER — Encounter (INDEPENDENT_AMBULATORY_CARE_PROVIDER_SITE_OTHER): Payer: Self-pay

## 2021-01-27 ENCOUNTER — Ambulatory Visit: Payer: 59 | Admitting: *Deleted

## 2021-01-27 ENCOUNTER — Encounter: Payer: Self-pay | Admitting: *Deleted

## 2021-01-27 ENCOUNTER — Other Ambulatory Visit: Payer: Self-pay

## 2021-01-27 ENCOUNTER — Ambulatory Visit: Payer: 59 | Attending: Obstetrics

## 2021-01-27 VITALS — BP 125/72 | HR 90

## 2021-01-27 DIAGNOSIS — O09293 Supervision of pregnancy with other poor reproductive or obstetric history, third trimester: Secondary | ICD-10-CM | POA: Diagnosis not present

## 2021-01-27 DIAGNOSIS — O3663X0 Maternal care for excessive fetal growth, third trimester, not applicable or unspecified: Secondary | ICD-10-CM | POA: Diagnosis not present

## 2021-01-27 DIAGNOSIS — Z3A30 30 weeks gestation of pregnancy: Secondary | ICD-10-CM

## 2021-01-27 DIAGNOSIS — Z8679 Personal history of other diseases of the circulatory system: Secondary | ICD-10-CM

## 2021-01-27 DIAGNOSIS — O3663X1 Maternal care for excessive fetal growth, third trimester, fetus 1: Secondary | ICD-10-CM | POA: Insufficient documentation

## 2021-01-27 DIAGNOSIS — O403XX0 Polyhydramnios, third trimester, not applicable or unspecified: Secondary | ICD-10-CM | POA: Diagnosis not present

## 2021-01-28 ENCOUNTER — Other Ambulatory Visit: Payer: Self-pay | Admitting: *Deleted

## 2021-01-28 DIAGNOSIS — O3663X0 Maternal care for excessive fetal growth, third trimester, not applicable or unspecified: Secondary | ICD-10-CM

## 2021-01-28 DIAGNOSIS — O409XX0 Polyhydramnios, unspecified trimester, not applicable or unspecified: Secondary | ICD-10-CM

## 2021-01-28 DIAGNOSIS — Z8759 Personal history of other complications of pregnancy, childbirth and the puerperium: Secondary | ICD-10-CM

## 2021-02-01 ENCOUNTER — Other Ambulatory Visit: Payer: Self-pay

## 2021-02-01 ENCOUNTER — Inpatient Hospital Stay (HOSPITAL_COMMUNITY)
Admission: AD | Admit: 2021-02-01 | Discharge: 2021-02-01 | Disposition: A | Discharge status: Home / Self Care | Payer: 59 | Attending: Obstetrics and Gynecology | Admitting: Obstetrics and Gynecology

## 2021-02-01 ENCOUNTER — Encounter (HOSPITAL_COMMUNITY): Payer: Self-pay | Admitting: Obstetrics and Gynecology

## 2021-02-01 DIAGNOSIS — W108XXA Fall (on) (from) other stairs and steps, initial encounter: Secondary | ICD-10-CM

## 2021-02-01 DIAGNOSIS — O9A213 Injury, poisoning and certain other consequences of external causes complicating pregnancy, third trimester: Secondary | ICD-10-CM | POA: Diagnosis not present

## 2021-02-01 DIAGNOSIS — S299XXA Unspecified injury of thorax, initial encounter: Secondary | ICD-10-CM | POA: Insufficient documentation

## 2021-02-01 DIAGNOSIS — Z3A31 31 weeks gestation of pregnancy: Secondary | ICD-10-CM | POA: Diagnosis not present

## 2021-02-01 DIAGNOSIS — Z3689 Encounter for other specified antenatal screening: Secondary | ICD-10-CM

## 2021-02-01 DIAGNOSIS — Z3493 Encounter for supervision of normal pregnancy, unspecified, third trimester: Secondary | ICD-10-CM

## 2021-02-01 LAB — URINALYSIS, ROUTINE W REFLEX MICROSCOPIC
Bilirubin Urine: NEGATIVE
Glucose, UA: NEGATIVE mg/dL
Hgb urine dipstick: NEGATIVE
Ketones, ur: NEGATIVE mg/dL
Leukocytes,Ua: NEGATIVE
Nitrite: NEGATIVE
Protein, ur: NEGATIVE mg/dL
Specific Gravity, Urine: 1.017 (ref 1.005–1.030)
pH: 8 (ref 5.0–8.0)

## 2021-02-01 NOTE — MAU Provider Note (Signed)
Event Date/Time   First Provider Initiated Contact with Patient 02/01/21 1933     S Ms. Lisa Frazier is a 30 y.o. [phone] pregnant female at [redacted]w[redacted]d who presents to MAU today after she fell down the stairs about an hour ago (1830). She was carrying her 30yo down the stairs, wearing socks but no shoes, and fell down several steps. She hit her lower and upper back, but did not twist or hit her stomach on anything. Denies any gush of fluid or vaginal bleeding, had some initial tightness like a long Braxton-Hicks, but no cramping or contractions. Feeling sore where her back hit the steps, but no severe pain and no pain elsewhere. Had not felt fetal movement since the fall, until put on the monitors in MAU. Now feeling regular and vigorous movement.  Receives care at [facility].  Pertinent items noted in HPI and remainder of comprehensive ROS otherwise negative.   O BP 120/78 (BP Location: Right Arm)   Pulse (!) 107   Temp 98.2 F (36.8 C) (Oral)   Resp 16   LMP 06/18/2020   SpO2 100%  Physical Exam Vitals and nursing note reviewed.  Constitutional:      Appearance: Normal appearance.  HENT:     Head: Normocephalic and atraumatic.     Right Ear: External ear normal.     Left Ear: External ear normal.     Nose: Nose normal.  Eyes:     Extraocular Movements: Extraocular movements intact.     Pupils: Pupils are equal, round, and reactive to light.  Cardiovascular:     Rate and Rhythm: Normal rate and regular rhythm.     Pulses: Normal pulses.  Pulmonary:     Effort: Pulmonary effort is normal.  Abdominal:     Palpations: Abdomen is soft.     Tenderness: There is no abdominal tenderness.  Musculoskeletal:        General: No tenderness or signs of injury. Normal range of motion.     Cervical back: Normal range of motion. No tenderness.  Skin:    General: Skin is warm.     Capillary Refill: Capillary refill takes less than 2 seconds.     Findings: No bruising or  erythema.  Neurological:     Mental Status: She is alert and oriented to person, place, and time.  Psychiatric:        Mood and Affect: Mood normal.        Behavior: Behavior normal.        Thought Content: Thought content normal.        Judgment: Judgment normal.   Fetal Tracing: reactive Baseline: 145 Variability: moderate Accelerations: 15x15 Decelerations: none Toco: UI, no contractions   Reassurance given that NST reactive and movement palpable, no need for further monitoring since she did not fall on or hit her belly. Encouraged to take Tylenol if she gets achy from the fall, as well as a warm bath with Epsom salts. Also encouraged to get/wear anti-slip socks or shoes while going down un-carpeted steps.  A Fall from stairs, initial encounter NST reactive Fetal movement present, third trimester  P Discharge from MAU in stable condition Follow up at [facility] as scheduled for ongoing prenatal care Warning signs for worsening condition that would warrant emergency follow-up discussed See AVS for additional education given.  Bernerd Limbo, [place] 02/01/2021 7:58 PM

## 2021-02-01 NOTE — MAU Note (Signed)
...  Lisa Frazier is a 30 y.o. at [redacted]w[redacted]d here in MAU reporting: slipped and fell down the stairs at 1830. Patient states she did not hit her stomach, she fell backwards and hit her lower and upper back. She states she has not felt any movement since then. Denies any vaginal bleeding or a gush of fluids. She states she has been experiencing braxton hicks for the past few days as well but no increase in CTX since her fall.   FHT: 152 initial external

## 2021-03-03 ENCOUNTER — Other Ambulatory Visit: Payer: Self-pay | Admitting: *Deleted

## 2021-03-03 ENCOUNTER — Ambulatory Visit: Payer: 59 | Admitting: Obstetrics

## 2021-03-03 ENCOUNTER — Ambulatory Visit: Payer: 59 | Attending: Obstetrics

## 2021-03-03 ENCOUNTER — Other Ambulatory Visit: Payer: Self-pay | Admitting: Obstetrics

## 2021-03-03 ENCOUNTER — Other Ambulatory Visit: Payer: Self-pay

## 2021-03-03 ENCOUNTER — Ambulatory Visit: Payer: 59 | Admitting: *Deleted

## 2021-03-03 VITALS — BP 115/71 | HR 106

## 2021-03-03 DIAGNOSIS — O09293 Supervision of pregnancy with other poor reproductive or obstetric history, third trimester: Secondary | ICD-10-CM | POA: Diagnosis not present

## 2021-03-03 DIAGNOSIS — O403XX0 Polyhydramnios, third trimester, not applicable or unspecified: Secondary | ICD-10-CM

## 2021-03-03 DIAGNOSIS — O403XX1 Polyhydramnios, third trimester, fetus 1: Secondary | ICD-10-CM | POA: Diagnosis present

## 2021-03-03 DIAGNOSIS — O409XX0 Polyhydramnios, unspecified trimester, not applicable or unspecified: Secondary | ICD-10-CM | POA: Diagnosis present

## 2021-03-03 DIAGNOSIS — Z8759 Personal history of other complications of pregnancy, childbirth and the puerperium: Secondary | ICD-10-CM | POA: Insufficient documentation

## 2021-03-03 DIAGNOSIS — O3663X0 Maternal care for excessive fetal growth, third trimester, not applicable or unspecified: Secondary | ICD-10-CM

## 2021-03-03 DIAGNOSIS — Z3A35 35 weeks gestation of pregnancy: Secondary | ICD-10-CM | POA: Diagnosis not present

## 2021-03-03 DIAGNOSIS — O133 Gestational [pregnancy-induced] hypertension without significant proteinuria, third trimester: Secondary | ICD-10-CM

## 2021-03-03 NOTE — Procedures (Signed)
Lisa Frazier Nov 09, 1990 [redacted]w[redacted]d  Fetus A Non-Stress Test Interpretation for 03/03/21  Indication: Polyhydramnios  Fetal Heart Rate A Mode: External Baseline Rate (A): 140 bpm Variability: Moderate Accelerations: 15 x 15  Uterine Activity Mode: Toco Contraction Frequency (min): UI Resting Tone Palpated: Relaxed Resting Time: Adequate  Interpretation (Fetal Testing) Nonstress Test Interpretation: Reactive Overall Impression: Reassuring for gestational age Comments: tracing reviewed by Dr. Parke Poisson

## 2021-03-07 LAB — OB RESULTS CONSOLE GBS: GBS: NEGATIVE

## 2021-03-13 ENCOUNTER — Ambulatory Visit: Payer: 59

## 2021-03-19 ENCOUNTER — Ambulatory Visit: Payer: 59

## 2021-03-20 ENCOUNTER — Telehealth (HOSPITAL_COMMUNITY): Payer: Self-pay | Admitting: *Deleted

## 2021-03-20 ENCOUNTER — Encounter (HOSPITAL_COMMUNITY): Payer: Self-pay | Admitting: *Deleted

## 2021-03-20 NOTE — Telephone Encounter (Signed)
Preadmission screen  

## 2021-03-22 ENCOUNTER — Inpatient Hospital Stay (HOSPITAL_COMMUNITY)
Admission: AD | Admit: 2021-03-22 | Discharge: 2021-03-24 | DRG: 807 | Disposition: A | Discharge status: Home / Self Care | Payer: 59 | Attending: Obstetrics and Gynecology | Admitting: Obstetrics and Gynecology

## 2021-03-22 ENCOUNTER — Other Ambulatory Visit: Payer: Self-pay

## 2021-03-22 DIAGNOSIS — O403XX0 Polyhydramnios, third trimester, not applicable or unspecified: Principal | ICD-10-CM | POA: Diagnosis present

## 2021-03-22 DIAGNOSIS — Z3490 Encounter for supervision of normal pregnancy, unspecified, unspecified trimester: Secondary | ICD-10-CM

## 2021-03-22 DIAGNOSIS — Z3A38 38 weeks gestation of pregnancy: Secondary | ICD-10-CM

## 2021-03-22 DIAGNOSIS — O3663X0 Maternal care for excessive fetal growth, third trimester, not applicable or unspecified: Secondary | ICD-10-CM | POA: Diagnosis present

## 2021-03-22 DIAGNOSIS — Z20822 Contact with and (suspected) exposure to covid-19: Secondary | ICD-10-CM | POA: Diagnosis present

## 2021-03-23 ENCOUNTER — Encounter (HOSPITAL_COMMUNITY): Payer: Self-pay | Admitting: Obstetrics and Gynecology

## 2021-03-23 ENCOUNTER — Inpatient Hospital Stay (HOSPITAL_COMMUNITY): Payer: 59 | Admitting: Anesthesiology

## 2021-03-23 DIAGNOSIS — Z3490 Encounter for supervision of normal pregnancy, unspecified, unspecified trimester: Secondary | ICD-10-CM

## 2021-03-23 DIAGNOSIS — Z3A38 38 weeks gestation of pregnancy: Secondary | ICD-10-CM | POA: Diagnosis not present

## 2021-03-23 DIAGNOSIS — Z20822 Contact with and (suspected) exposure to covid-19: Secondary | ICD-10-CM | POA: Diagnosis present

## 2021-03-23 DIAGNOSIS — O3663X0 Maternal care for excessive fetal growth, third trimester, not applicable or unspecified: Secondary | ICD-10-CM | POA: Diagnosis present

## 2021-03-23 DIAGNOSIS — O403XX0 Polyhydramnios, third trimester, not applicable or unspecified: Secondary | ICD-10-CM | POA: Diagnosis present

## 2021-03-23 LAB — CBC
HCT: 32.8 % — ABNORMAL LOW (ref 36.0–46.0)
HCT: 30 % — ABNORMAL LOW (ref 36.0–46.0)
Hemoglobin: 10.1 g/dL — ABNORMAL LOW (ref 12.0–15.0)
Hemoglobin: 9.4 g/dL — ABNORMAL LOW (ref 12.0–15.0)
MCH: 25.1 pg — ABNORMAL LOW (ref 26.0–34.0)
MCH: 24.9 pg — ABNORMAL LOW (ref 26.0–34.0)
MCHC: 30.8 g/dL (ref 30.0–36.0)
MCHC: 31.3 g/dL (ref 30.0–36.0)
MCV: 81.4 fL (ref 80.0–100.0)
MCV: 79.6 fL — ABNORMAL LOW (ref 80.0–100.0)
Platelets: 261 10*3/uL (ref 150–400)
Platelets: 237 10*3/uL (ref 150–400)
RBC: 4.03 MIL/uL (ref 3.87–5.11)
RBC: 3.77 MIL/uL — ABNORMAL LOW (ref 3.87–5.11)
RDW: 12.5 % (ref 11.5–15.5)
RDW: 12.6 % (ref 11.5–15.5)
WBC: 12.1 10*3/uL — ABNORMAL HIGH (ref 4.0–10.5)
WBC: 16.1 10*3/uL — ABNORMAL HIGH (ref 4.0–10.5)
nRBC: 0.2 % (ref 0.0–0.2)
nRBC: 0 % (ref 0.0–0.2)

## 2021-03-23 LAB — TYPE AND SCREEN
ABO/RH(D): A POS
Antibody Screen: NEGATIVE

## 2021-03-23 LAB — RESP PANEL BY RT-PCR (FLU A&B, COVID) ARPGX2
Influenza A by PCR: NEGATIVE
Influenza B by PCR: NEGATIVE
SARS Coronavirus 2 by RT PCR: NEGATIVE

## 2021-03-23 LAB — RPR: RPR Ser Ql: NONREACTIVE

## 2021-03-23 MED ORDER — OXYTOCIN BOLUS FROM INFUSION
333.0000 mL | Freq: Once | INTRAVENOUS | Status: AC
  Administered 2021-03-23: 333 mL via INTRAVENOUS

## 2021-03-23 MED ORDER — BENZOCAINE-MENTHOL 20-0.5 % EX AERO
1.0000 "application " | INHALATION_SPRAY | CUTANEOUS | Status: DC | PRN
  Administered 2021-03-23: 1 via TOPICAL
  Filled 2021-03-23: qty 56

## 2021-03-23 MED ORDER — LACTATED RINGERS IV SOLN: 500.0000 mL | INTRAVENOUS | Status: DC | PRN

## 2021-03-23 MED ORDER — ONDANSETRON HCL 4 MG/2ML IJ SOLN: 4.0000 mg | Freq: Four times a day (QID) | INTRAMUSCULAR | Status: DC | PRN

## 2021-03-23 MED ORDER — LACTATED RINGERS IV SOLN: INTRAVENOUS | Status: DC

## 2021-03-23 MED ORDER — WITCH HAZEL-GLYCERIN EX PADS: 1.0000 "application " | MEDICATED_PAD | CUTANEOUS | Status: DC | PRN

## 2021-03-23 MED ORDER — SIMETHICONE 80 MG PO CHEW: 80.0000 mg | CHEWABLE_TABLET | ORAL | Status: DC | PRN

## 2021-03-23 MED ORDER — FENTANYL-BUPIVACAINE-NACL 0.5-0.125-0.9 MG/250ML-% EP SOLN
EPIDURAL | Status: AC
  Filled 2021-03-23: qty 250

## 2021-03-23 MED ORDER — OXYCODONE-ACETAMINOPHEN 5-325 MG PO TABS: 1.0000 | ORAL_TABLET | ORAL | Status: DC | PRN

## 2021-03-23 MED ORDER — DIPHENHYDRAMINE HCL 50 MG/ML IJ SOLN: 12.5000 mg | INTRAMUSCULAR | Status: DC | PRN

## 2021-03-23 MED ORDER — DIPHENHYDRAMINE HCL 25 MG PO CAPS: 25.0000 mg | ORAL_CAPSULE | Freq: Four times a day (QID) | ORAL | Status: DC | PRN

## 2021-03-23 MED ORDER — ACETAMINOPHEN 325 MG PO TABS: 650.0000 mg | ORAL_TABLET | ORAL | Status: DC | PRN

## 2021-03-23 MED ORDER — TETANUS-DIPHTH-ACELL PERTUSSIS 5-2.5-18.5 LF-MCG/0.5 IM SUSY: 0.5000 mL | PREFILLED_SYRINGE | Freq: Once | INTRAMUSCULAR | Status: DC

## 2021-03-23 MED ORDER — LACTATED RINGERS IV SOLN: 500.0000 mL | Freq: Once | INTRAVENOUS | Status: DC

## 2021-03-23 MED ORDER — FENTANYL-BUPIVACAINE-NACL 0.5-0.125-0.9 MG/250ML-% EP SOLN: 12.0000 mL/h | EPIDURAL | Status: DC | PRN

## 2021-03-23 MED ORDER — EPHEDRINE 5 MG/ML INJ: 10.0000 mg | INTRAVENOUS | Status: DC | PRN

## 2021-03-23 MED ORDER — BUTORPHANOL TARTRATE 1 MG/ML IJ SOLN: 1.0000 mg | INTRAMUSCULAR | Status: DC | PRN

## 2021-03-23 MED ORDER — OXYCODONE-ACETAMINOPHEN 5-325 MG PO TABS: 2.0000 | ORAL_TABLET | ORAL | Status: DC | PRN

## 2021-03-23 MED ORDER — PHENYLEPHRINE 40 MCG/ML (10ML) SYRINGE FOR IV PUSH (FOR BLOOD PRESSURE SUPPORT)
PREFILLED_SYRINGE | INTRAVENOUS | Status: AC
  Filled 2021-03-23: qty 10

## 2021-03-23 MED ORDER — OXYCODONE HCL 5 MG PO TABS: 5.0000 mg | ORAL_TABLET | ORAL | Status: DC | PRN

## 2021-03-23 MED ORDER — HYDROXYZINE HCL 50 MG PO TABS: 50.0000 mg | ORAL_TABLET | Freq: Four times a day (QID) | ORAL | Status: DC | PRN

## 2021-03-23 MED ORDER — ONDANSETRON HCL 4 MG/2ML IJ SOLN: 4.0000 mg | INTRAMUSCULAR | Status: DC | PRN

## 2021-03-23 MED ORDER — FENTANYL-BUPIVACAINE-NACL 0.5-0.125-0.9 MG/250ML-% EP SOLN
12.0000 mL/h | EPIDURAL | Status: DC | PRN
  Administered 2021-03-23: 12 mL/h via EPIDURAL

## 2021-03-23 MED ORDER — OXYCODONE HCL 5 MG PO TABS: 10.0000 mg | ORAL_TABLET | ORAL | Status: DC | PRN

## 2021-03-23 MED ORDER — PHENYLEPHRINE 40 MCG/ML (10ML) SYRINGE FOR IV PUSH (FOR BLOOD PRESSURE SUPPORT): 80.0000 ug | PREFILLED_SYRINGE | INTRAVENOUS | Status: DC | PRN

## 2021-03-23 MED ORDER — OXYTOCIN-SODIUM CHLORIDE 30-0.9 UT/500ML-% IV SOLN
2.5000 [IU]/h | INTRAVENOUS | Status: DC
  Administered 2021-03-23: 2.5 [IU]/h via INTRAVENOUS
  Filled 2021-03-23: qty 500

## 2021-03-23 MED ORDER — LIDOCAINE-EPINEPHRINE (PF) 2 %-1:200000 IJ SOLN
INTRAMUSCULAR | Status: DC | PRN
  Administered 2021-03-23: 5 mL via EPIDURAL

## 2021-03-23 MED ORDER — LIDOCAINE HCL (PF) 1 % IJ SOLN: 30.0000 mL | INTRAMUSCULAR | Status: DC | PRN

## 2021-03-23 MED ORDER — PRENATAL MULTIVITAMIN CH
1.0000 | ORAL_TABLET | Freq: Every day | ORAL | Status: DC
  Administered 2021-03-23: 1 via ORAL
  Filled 2021-03-23: qty 1

## 2021-03-23 MED ORDER — SENNOSIDES-DOCUSATE SODIUM 8.6-50 MG PO TABS
2.0000 | ORAL_TABLET | ORAL | Status: DC
  Administered 2021-03-23: 2 via ORAL
  Filled 2021-03-23: qty 2

## 2021-03-23 MED ORDER — IBUPROFEN 600 MG PO TABS
600.0000 mg | ORAL_TABLET | Freq: Four times a day (QID) | ORAL | Status: DC
  Administered 2021-03-23 – 2021-03-24 (×6): 600 mg via ORAL
  Filled 2021-03-23 (×6): qty 1

## 2021-03-23 MED ORDER — DIBUCAINE (PERIANAL) 1 % EX OINT: 1.0000 "application " | TOPICAL_OINTMENT | CUTANEOUS | Status: DC | PRN

## 2021-03-23 MED ORDER — LACTATED RINGERS IV SOLN
500.0000 mL | Freq: Once | INTRAVENOUS | Status: AC
  Administered 2021-03-23: 500 mL via INTRAVENOUS

## 2021-03-23 MED ORDER — COCONUT OIL OIL
1.0000 "application " | TOPICAL_OIL | Status: DC | PRN
  Administered 2021-03-23: 1 via TOPICAL

## 2021-03-23 MED ORDER — SOD CITRATE-CITRIC ACID 500-334 MG/5ML PO SOLN: 30.0000 mL | ORAL | Status: DC | PRN

## 2021-03-23 MED ORDER — ONDANSETRON HCL 4 MG PO TABS: 4.0000 mg | ORAL_TABLET | ORAL | Status: DC | PRN

## 2021-03-23 MED ORDER — ZOLPIDEM TARTRATE 5 MG PO TABS: 5.0000 mg | ORAL_TABLET | Freq: Every evening | ORAL | Status: DC | PRN

## 2021-03-23 NOTE — L&D Delivery Note (Signed)
Delivery Note At 3:21 AM a viable female was delivered via  (Presentation:  LOA    ).  APGAR: pending ; weight  pending   Placenta status: Spontaneous, Intact.  Cord:   with the following complications:  None.  Cord pH: not sent  Anesthesia:  CLE Episiotomy:  None Lacerations:  2nd degree Suture Repair: 3.0 vicryl Est. Blood Loss (mL):  200cc   Mom to postpartum.  Baby to Couplet care / Skin to Skin.  [name] 03/23/2021, 3:40 AM

## 2021-03-23 NOTE — Anesthesia Preprocedure Evaluation (Addendum)
Anesthesia Evaluation  Patient identified by MRN, date of birth, ID band Patient awake    Reviewed: Allergy & Precautions, Patient's Chart, lab work & pertinent test results  Airway Mallampati: I       Dental no notable dental hx.    Pulmonary    Pulmonary exam normal        Cardiovascular hypertension, Normal cardiovascular exam     Neuro/Psych Anxiety    GI/Hepatic   Endo/Other    Renal/GU      Musculoskeletal   Abdominal   Peds  Hematology negative hematology ROS (+)   Anesthesia Other Findings   Reproductive/Obstetrics (+) Pregnancy                            Anesthesia Physical Anesthesia Plan  ASA: 2  Anesthesia Plan: Epidural   Post-op Pain Management:    Induction:   PONV Risk Score and Plan: 0  Airway Management Planned: Natural Airway  Additional Equipment: None  Intra-op Plan:   Post-operative Plan:   Informed Consent: I have reviewed the patients History and Physical, chart, labs and discussed the procedure including the risks, benefits and alternatives for the proposed anesthesia with the patient or authorized representative who has indicated his/her understanding and acceptance.       Plan Discussed with:   Anesthesia Plan Comments: (Lab Results      Component                Value               Date                      WBC                      12.1 (H)            03/23/2021                HGB                      10.1 (L)            03/23/2021                HCT                      32.8 (L)            03/23/2021                MCV                      81.4                03/23/2021                PLT                      261                 03/23/2021           )       Anesthesia Quick Evaluation

## 2021-03-23 NOTE — H&P (Addendum)
Lisa Frazier is a 31 y.o. female presenting for labor. Hx 3 prior SVD. This pregnancy c/b polyhydramnios and LGA with EFW 8#8 in the 97%ile. Pelvis proven to 8#8 with rapid delivery of that baby.   OB History     Gravida  4   Para  3   Term  3   Preterm      AB      Living  3      SAB      IAB      Ectopic      Multiple      Live Births             Past Medical History:  Diagnosis Date   Anxiety    Pregnancy induced hypertension    Past Surgical History:  Procedure Laterality Date   DRUG INDUCED ENDOSCOPY     WISDOM TOOTH EXTRACTION     Family History: family history includes Cancer in her paternal grandmother; Heart disease in her father; Hyperlipidemia in her father and mother; Kidney disease in her father; Stroke in her maternal grandmother and paternal grandfather. Social History:  reports that she has never smoked. She has never used smokeless tobacco. She reports that she does not currently use alcohol. She reports that she does not use drugs.     Maternal Diabetes: No Genetic Screening: Normal Maternal Ultrasounds/Referrals: Normal Fetal Ultrasounds or other Referrals:  None Maternal Substance Abuse:  No Significant Maternal Medications:  None Significant Maternal Lab Results:  None and Group B Strep negative Other Comments:  None  Review of Systems History Dilation: 4.5 Effacement (%): 80 Station: -2 Exam by:: Karl Ito, rnc Blood pressure 128/82, pulse 87, temperature 97.8 F (36.6 C), temperature source Oral, resp. rate 17, last menstrual period 06/18/2020, SpO2 98 %. Exam Physical Exam  NAD, A&O NWOB Abd soft, nondistended, gravid  Prenatal labs: ABO, Rh: A/Positive/-- (08/09 0000) Antibody: Negative (08/09 0000) Rubella: Immune (08/09 0000) RPR: Nonreactive (08/09 0000)  HBsAg: Negative (08/09 0000)  HIV: Non-reactive (08/09 0000)  GBS: Negative/-- (12/16 0000)   Assessment/Plan: 31 yo G4P3003 @ 38.3 wga  presenting in labor. Counseled regarding risks of LGA. AROM when able. Expectant management for now. GBS negative.     Lisa Frazier 03/23/2021, 12:34 AM

## 2021-03-23 NOTE — MAU Note (Signed)
UC since 3pm every 5-47mins, denies bleeding or leaking fluid.

## 2021-03-23 NOTE — Lactation Note (Signed)
This note was copied from a baby's chart. Lactation Consultation Note  Patient Name: Boy Adysson Revelle [id number] Date: 03/23/2021 Reason for consult: Follow-up assessment;Early term 37-38.6wks;Other (Comment) (per mom recently fed/ mom requested a LC feeding assessment . mom aware to call with feeding assessment) Age:32 hours LC reviewed the doc flow sheets and updated per mom , Latch range 8-10 , voided x 3 , HNS . LC reassured mom the baby has 24 hours to stool for the 1st time.  Per mom has a 14 month at home she is still breast feeding a few times a day.  Mom asked if it was ok to feed the 74 month old and the baby. LC reassured her that its ok , important to always make sure the newborns feeding  needs are met 1st. And the 27 month old 2nd .  Mom aware to call with feeding cues.   Maternal Data    Feeding Mother's Current Feeding Choice: Breast Milk  LATCH Score                    Lactation Tools Discussed/Used    Interventions Interventions: Breast feeding basics reviewed  Discharge    Consult Status Consult Status: Follow-up Date: 03/23/21 Follow-up type: In-patient    [place] 03/23/2021, 2:38 PM

## 2021-03-23 NOTE — Progress Notes (Signed)
9/c/+1 Controlled AROM with copious clear fluid

## 2021-03-23 NOTE — Anesthesia Postprocedure Evaluation (Signed)
Anesthesia Post Note  Patient: Lisa Frazier  Procedure(s) Performed: AN AD HOC LABOR EPIDURAL     Patient location during evaluation: Mother Baby Anesthesia Type: Epidural Level of consciousness: awake and alert Pain management: pain level controlled Vital Signs Assessment: post-procedure vital signs reviewed and stable Respiratory status: spontaneous breathing, nonlabored ventilation and respiratory function stable Cardiovascular status: stable Postop Assessment: no headache, no backache, epidural receding, patient able to bend at knees, no apparent nausea or vomiting, able to ambulate and adequate PO intake Anesthetic complications: no   No notable events documented.  Last Vitals:  Vitals:   03/23/21 1034 03/23/21 1500  BP: 110/72 130/84  Pulse: 66 78  Resp: 18 17  Temp: 36.8 C 36.7 C  SpO2:  98%    Last Pain:  Vitals:   03/23/21 1500  TempSrc: Oral  PainSc:    Pain Goal:                Epidural/Spinal Function Cutaneous sensation: Normal sensation (03/23/21 1500), Patient able to flex knees: Yes (03/23/21 1500), Patient able to lift hips off bed: Yes (03/23/21 1500), Back pain beyond tenderness at insertion site: No (03/23/21 1500), Progressively worsening motor and/or sensory loss: No (03/23/21 1500), Bowel and/or bladder incontinence post epidural: No (03/23/21 1500)  [name]

## 2021-03-23 NOTE — Lactation Note (Signed)
This note was copied from a baby's chart. Lactation Consultation Note  Patient Name: Lisa Frazier [id number] Date: 03/23/2021 Reason for consult: Follow-up assessment;Early term 37-38.6wks Age:32 hours   P4 mother whose infant is now 74 hours old.  This is an ETI at 38+3 weeks.  Mother's current feeding preference is breast.  Previous LC suggested mother call back for a latch assessment.  Mother had not called when I arrived for a visit.  She had recently breast fed and baby was asleep.  Mother had recorded the feeding and I observed the video.  It appeared that she had a fairly good latch, however, I feel the baby could be latched more deeply.  Discussed how to obtain a deep latch and suggested she call for a latch observation with the next feeding.  Mother very receptive to learning and feels like she may have forgotten a few basic concepts regarding breast feeding a newborn.  She plans to continue breast feeding her 56 months old as baby desires; reminded her to allow the newborn to feed before the toddler feeds.  Mother verbalized understanding.   Maternal Data Has patient been taught Hand Expression?: Yes Does the patient have breastfeeding experience prior to this delivery?: Yes How long did the patient breastfeed?: Mother breast fed all of her chidlren and is still breast feeding her 22 month old  Feeding Mother's Current Feeding Choice: Breast Milk  LATCH Score                    Lactation Tools Discussed/Used    Interventions Interventions: Breast feeding basics reviewed;Education  Discharge Pump: Personal (Spectra) WIC Program: No  Consult Status Consult Status: Follow-up Date: 03/24/21 Follow-up type: In-patient    Dora Sims 03/23/2021, 6:33 PM

## 2021-03-23 NOTE — Lactation Note (Signed)
This note was copied from a baby's chart. Lactation Consultation Note Mom had all ready latched and baby BF before LC came. Mom latched again to opposite breast so LC could see latch. Baby BF great. Mom has 34 month old at home that is BF in am and afternoon. Mom has cut back the BF since she has been pregnant. Mom is thinking she is going to stop BF her 51 month old totally. Mom has 32 yr old that she exclusively pumped and her 32 yr old she BF for 3 months and baby wasn't gaining weight like she should so mom didn't want to pump again so she just gave formula. Her 3rd child is the 60 month old she didn't want to give bottles and didn't until he was 81 months old and then he wouldn't take a bottle at all. Mom doesn't think she wants to be BF 2 children so she is thinking she is going to totally stop BF her 64 month old. Encouraged mom to call for questions or concerns.  Patient Name: Lisa Frazier [id number] Date: 03/23/2021 Reason for consult: L&D Initial assessment;Early term 37-38.6wks Age:42 hours  Maternal Data Has patient been taught Hand Expression?: Yes Does the patient have breastfeeding experience prior to this delivery?: Yes How long did the patient breastfeed?: mom still BF her 57 month old  Feeding    LATCH Score Latch: Grasps breast easily, tongue down, lips flanged, rhythmical sucking.  Audible Swallowing: Spontaneous and intermittent  Type of Nipple: Everted at rest and after stimulation  Comfort (Breast/Nipple): Soft / non-tender  Hold (Positioning): No assistance needed to correctly position infant at breast.  LATCH Score: 10   Lactation Tools Discussed/Used    Interventions Interventions: Skin to skin  Discharge    Consult Status Consult Status: Follow-up from L&D Date: 03/23/21 Follow-up type: In-patient    Charyl Dancer 03/23/2021, 4:35 AM

## 2021-03-23 NOTE — Anesthesia Procedure Notes (Signed)
Epidural Patient location during procedure: OB Start time: 03/23/2021 1:47 AM End time: 03/23/2021 1:53 AM  Staffing Anesthesiologist: Shelton Silvas, MD Performed: anesthesiologist   Preanesthetic Checklist Completed: patient identified, IV checked, site marked, risks and benefits discussed, surgical consent, monitors and equipment checked, pre-op evaluation and timeout performed  Epidural Patient position: sitting Prep: DuraPrep Patient monitoring: heart rate, continuous pulse ox and blood pressure Approach: midline Location: L3-L4 Injection technique: LOR saline  Needle:  Needle type: Tuohy  Needle gauge: 17 G Needle length: 9 cm Catheter type: closed end flexible Catheter size: 20 Guage Test dose: negative and 1.5% lidocaine  Assessment Events: blood not aspirated, injection not painful, no injection resistance and no paresthesia  Additional Notes LOR @ 4.5  Patient identified. Risks/Benefits/Options discussed with patient including but not limited to bleeding, infection, nerve damage, paralysis, failed block, incomplete pain control, headache, blood pressure changes, nausea, vomiting, reactions to medications, itching and postpartum back pain. Confirmed with bedside nurse the patient's most recent platelet count. Confirmed with patient that they are not currently taking any anticoagulation, have any bleeding history or any family history of bleeding disorders. Patient expressed understanding and wished to proceed. All questions were answered. Sterile technique was used throughout the entire procedure. Please see nursing notes for vital signs. Test dose was given through epidural catheter and negative prior to continuing to dose epidural or start infusion. Warning signs of high block given to the patient including shortness of breath, tingling/numbness in hands, complete motor block, or any concerning symptoms with instructions to call for help. Patient was given instructions on  fall risk and not to get out of bed. All questions and concerns addressed with instructions to call with any issues or inadequate analgesia.    Reason for block:procedure for pain

## 2021-03-24 NOTE — Lactation Note (Signed)
This note was copied from a baby's chart. Lactation Consultation Note  Patient Name: Lisa Frazier [id number] Date: 03/24/2021 Reason for consult: Follow-up assessment;Early term 37-38.6wks;Infant weight loss Age:32 hours Baby is post circ. Baby latched when [facility] entered the room with depth. Mom requested to check the latch and LC added a pillow for support. Multiple swallows noted and increased with breast compressions.  LC reassured mom and dad baby is doing well with breast feeding, weight loss, and output.  Mom aware of the [facility] resources after D/C.    Maternal Data Has patient been taught Hand Expression?: Yes  Feeding Mother's Current Feeding Choice: Breast Milk  LATCH Score Latch:  (latched with)  Audible Swallowing:  (swallows noted)  Type of Nipple:  (nipple well rounded when the baby released)  Comfort (Breast/Nipple):  (per mom comfortable)  Hold (Positioning):  (mom independent with latch)      Lactation Tools Discussed/Used - LC checked the flange size #24 F good size for today , #27 F is for when the milk comes in.  Tools: Pump;Flanges Flange Size: 24;27 Breast pump type: Manual Pump Education: Milk Storage;Setup, frequency, and cleaning Reason for Pumping: PRN  Interventions Interventions: Breast feeding basics reviewed;Skin to skin;Breast compression;Adjust position;Support pillows;Position options;Hand pump;Education;LC Services brochure  Discharge Discharge Education: Engorgement and breast care;Warning signs for feeding baby Pump: Personal;DEBP;Manual WIC Program: No  Consult Status Consult Status: Complete Date: 03/24/21    Lisa Frazier Yielding 03/24/2021, 10:12 AM

## 2021-03-24 NOTE — Lactation Note (Signed)
This note was copied from a baby's chart. Lactation Consultation Note  Patient Name: Lisa Frazier [id number] Date: 03/24/2021   Age:32 hours Per RN Delrae Rend) mom declined [place] would like to be seen in the morning by [facility] services. Maternal Data    Feeding    LATCH Score                    Lactation Tools Discussed/Used    Interventions    Discharge    Consult Status      Vicente Serene 03/24/2021, 1:27 AM

## 2021-03-24 NOTE — Discharge Summary (Signed)
° °  Postpartum Discharge Summary ° °   °Patient Name: Lisa Frazier °DOB: 03/05/1991 °MRN: [id number] ° °Date of admission: 03/22/2021 °Delivery date:03/23/2021  °Delivering provider: LEGER, ELISE JENNIFER  °Date of discharge: 03/24/2021 ° °Admitting diagnosis: Pregnancy [Z34.90] °Intrauterine pregnancy: [redacted]w[redacted]d     °Secondary diagnosis:  Principal Problem: °  Pregnancy ° °Additional problems: None    °Discharge diagnosis: Term Pregnancy Delivered                                              °Post partum procedures: None °Augmentation: AROM °Complications: None ° °Hospital course: Onset of Labor With Vaginal Delivery      °31 y.o. yo G4P4004 at [redacted]w[redacted]d was admitted in Latent Labor on 03/22/2021. Patient had an uncomplicated labor course as follows:  °Membrane Rupture Time/Date: 2:29 AM ,03/23/2021   °Delivery Method:Vaginal, Spontaneous  °Episiotomy: None  °Lacerations:  2nd degree;Perineal  °Patient had an uncomplicated postpartum course.  She is ambulating, tolerating a regular diet, passing flatus, and urinating well. Patient is discharged home in stable condition on 03/24/21. ° °Newborn Data: °Birth date:03/23/2021  °Birth time:3:21 AM  °Gender:Female  °Living status:Living  °Apgars:8 ,9  °Weight:4070 g  ° °Magnesium Sulfate received: No °BMZ received: No °Rhophylac:N/A °MMR:N/A °T-DaP:Given prenatally °Flu: N/A °Transfusion:No ° °Physical exam  °Vitals:  ° 03/23/21 1034 03/23/21 1500 03/23/21 2006 03/24/21 0534  °BP: 110/72 130/84 123/79 107/68  °Pulse: 66 78 88 76  °Resp: 18 17 18 18  °Temp: 98.2 °F (36.8 °C) 98 °F (36.7 °C) 98.1 °F (36.7 °C) 98 °F (36.7 °C)  °TempSrc: Oral Oral Oral Oral  °SpO2:  98% 98% 98%  ° °General: alert °Lochia: appropriate °Uterine Fundus: firm °Incision: N/A °DVT Evaluation: No evidence of DVT seen on physical exam. °Labs: °Lab Results  °Component Value Date  ° WBC 16.1 (H) 03/23/2021  ° HGB 9.4 (L) 03/23/2021  ° HCT 30.0 (L) 03/23/2021  ° MCV 79.6 (L) 03/23/2021  ° PLT 237 03/23/2021  ° °No  flowsheet data found. °Edinburgh Score: °Edinburgh Postnatal Depression Scale Screening Tool 03/24/2021  °I have been able to laugh and see the funny side of things. 0  °I have looked forward with enjoyment to things. 0  °I have blamed myself unnecessarily when things went wrong. 1  °I have been anxious or worried for no good reason. 2  °I have felt scared or panicky for no good reason. 0  °Things have been getting on top of me. 0  °I have been so unhappy that I have had difficulty sleeping. 0  °I have felt sad or miserable. 0  °I have been so unhappy that I have been crying. 0  °The thought of harming myself has occurred to me. 0  °Edinburgh Postnatal Depression Scale Total 3  ° ° ° ° °After visit meds:  °Allergies as of 03/24/2021   °No Known Allergies °  ° °  °Medication List  °  ° °STOP taking these medications   ° °aspirin EC 81 MG tablet °  °ferrous sulfate 325 (65 FE) MG tablet °  ° °  ° °TAKE these medications   ° °prenatal vitamin w/FE, FA 27-1 MG Tabs tablet °Take 1 tablet by mouth daily at 12 noon. °  ° °  ° °D/W patient female infant circumcision, risks/benefits reviewed. All questions answered. ° ° °Discharge home in stable condition °  Infant Feeding: Bottle and Breast °Infant Disposition:home with mother °Discharge instruction: per After Visit Summary and Postpartum booklet. °Activity: Advance as tolerated. Pelvic rest for 6 weeks.  °Diet: routine diet °Anticipated Birth Control: Unsure °Postpartum Appointment:6 weeks °Additional Postpartum F/U:  None °Future Appointments:No future appointments. °Follow up Visit: ° ° °  ° °03/24/2021 °Elise Jennifer Leger, MD ° ° °

## 2021-03-24 NOTE — Social Work (Signed)
MOB was referred for history of anxiety.   * Referral screened out by Clinical Social Worker because none of the following criteria appear to apply:  ~ History of anxiety/depression during this pregnancy, or of post-partum depression following prior delivery. ~ Diagnosis of anxiety and/or depression within last 3 years. Per prenatal notes, MOB experienced PPA following the birth of her first child. OR * MOB's symptoms currently being treated with medication and/or therapy.  MOB received a 3 on the Edinburgh Postpartum Depression Scale.  Please contact the Clinical Social Worker if needs arise or by MOB request.  Manfred Arch, LCSWA Clinical Social Work [employer] [facility] [employer]  [phone]

## 2021-03-27 ENCOUNTER — Ambulatory Visit: Payer: 59

## 2021-03-28 ENCOUNTER — Inpatient Hospital Stay (HOSPITAL_COMMUNITY): Payer: No Typology Code available for payment source

## 2021-03-28 ENCOUNTER — Inpatient Hospital Stay (HOSPITAL_COMMUNITY)
Admission: AD | Admit: 2021-03-28 | Payer: No Typology Code available for payment source | Source: Home / Self Care | Admitting: Obstetrics and Gynecology

## 2021-04-07 ENCOUNTER — Telehealth (HOSPITAL_COMMUNITY): Payer: Self-pay | Admitting: *Deleted

## 2021-04-07 NOTE — Telephone Encounter (Signed)
Attempted hospital discharge follow-up call. Left message for patient to return RN call. Erline Levine, RN, 04/07/21, [phone]

## 2022-04-23 IMAGING — US US MFM OB DETAIL+14 WK
1 series · 12 of 28 positions shown · non-contrast
Comparison: none

[Series 1: us mfm ob detail+14 wk · 12 of 111 slices shown]
[im 5/111]
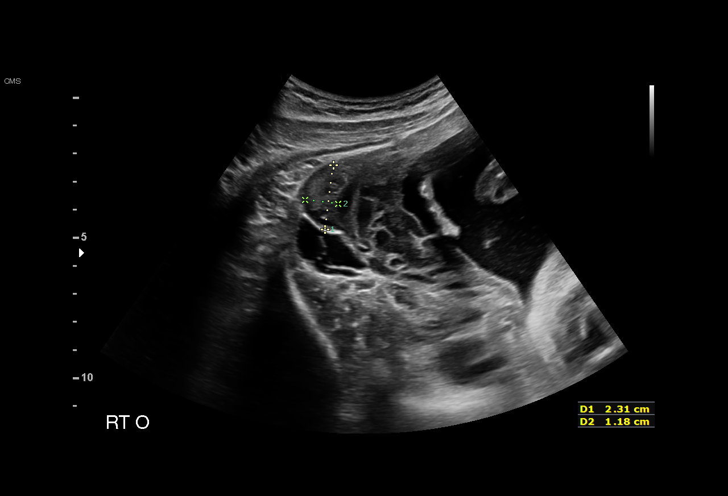
[im 13/111]
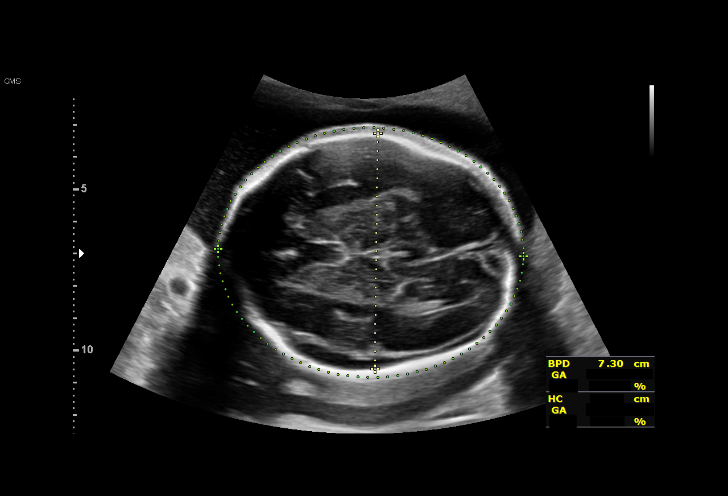
[im 21/111]
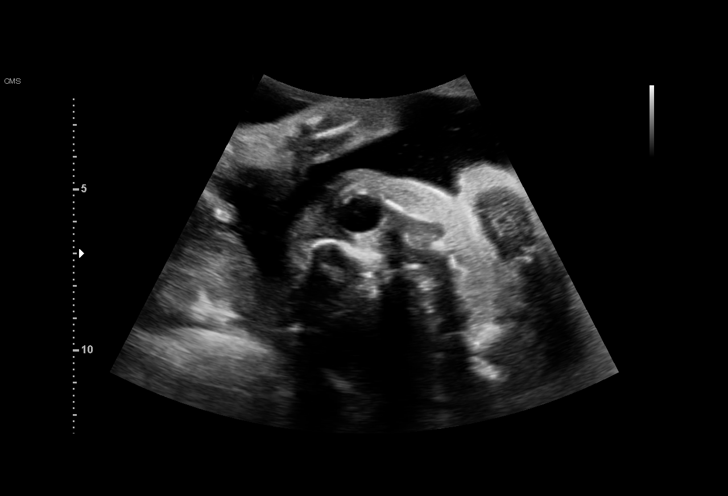
[im 33/111]
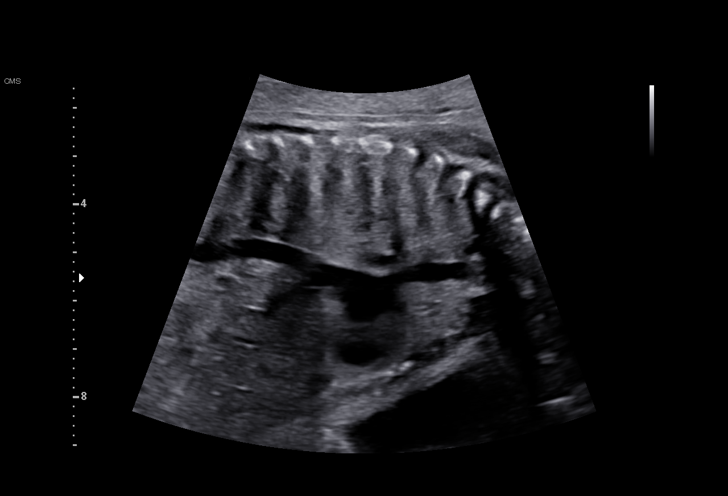
[im 41/111]
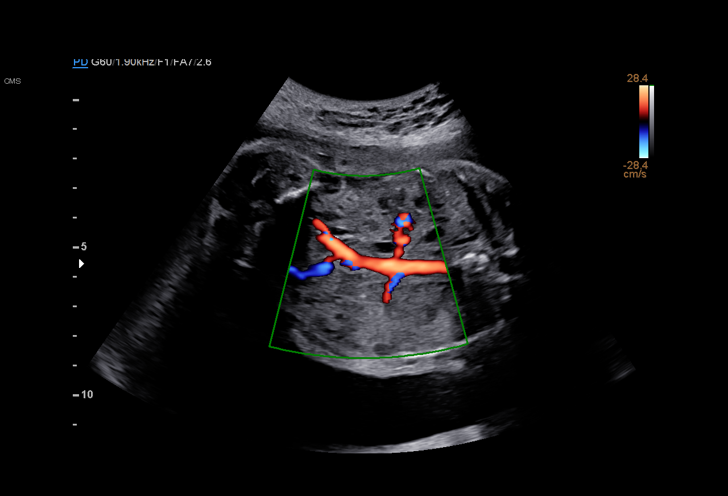
[im 49/111]
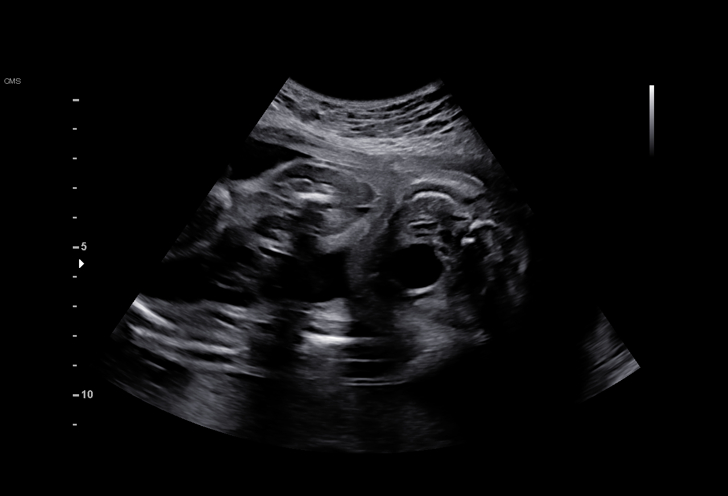
[im 62/111]
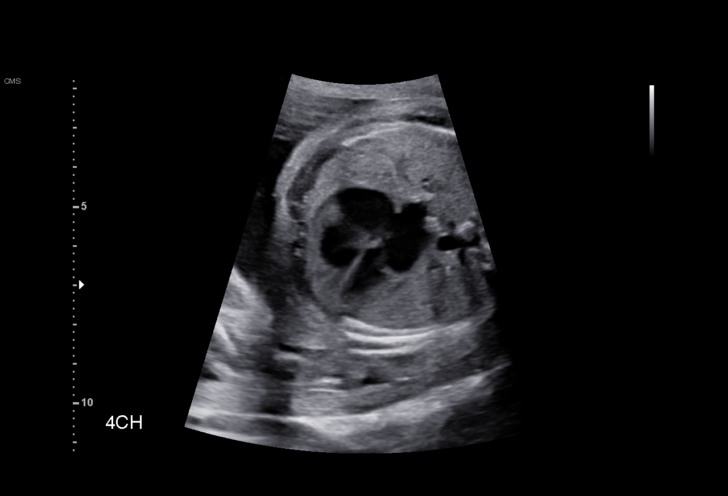
[im 70/111]
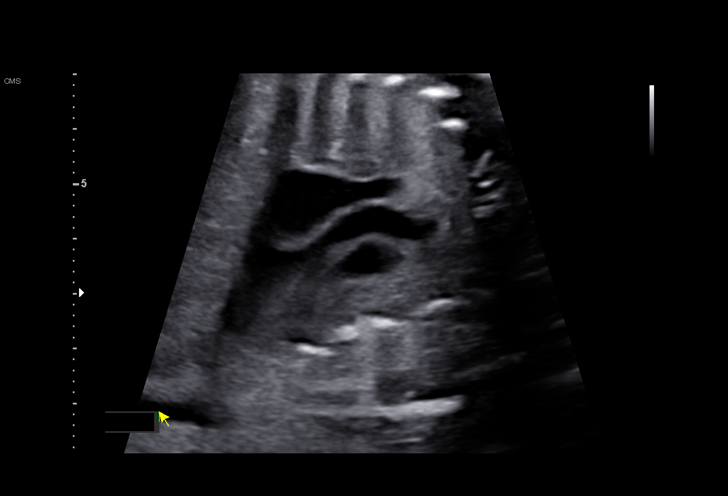
[im 78/111]
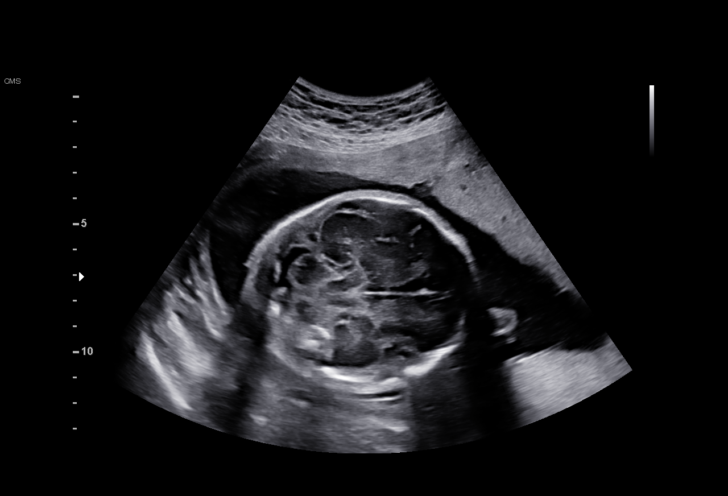
[im 90/111]
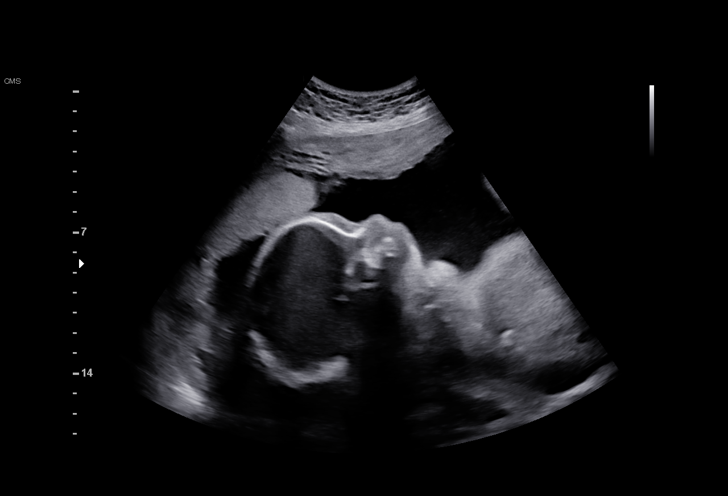
[im 98/111]
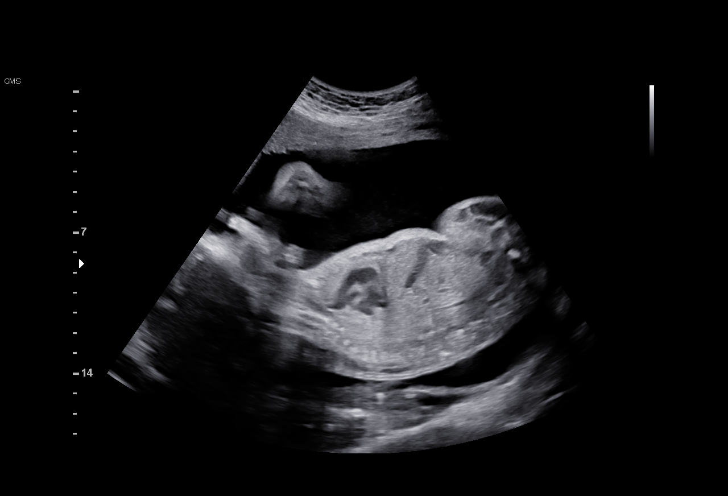
[im 106/111]
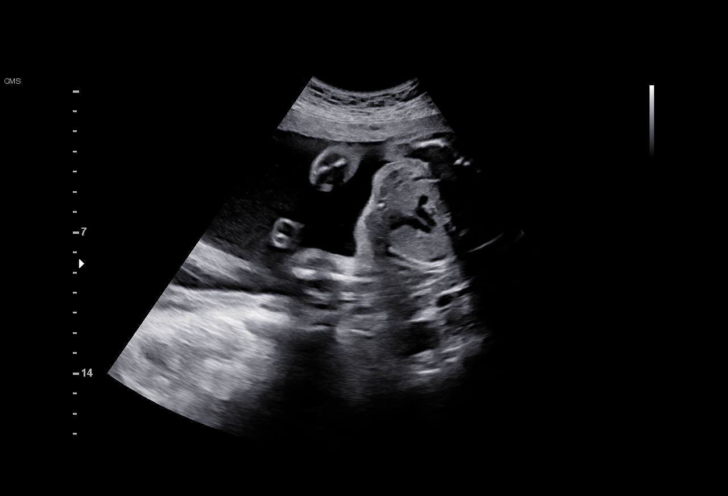

[12 of 28 positions shown; findings below may reference images not displayed]

Indications

 Polyhydramnios, second trimester,
 antepartum condition or complication, fetus 1
 Poor obstetric history: gestational HTN
 26 weeks gestation of pregnancy
Fetal Evaluation

 Num Of Fetuses:         1
 Fetal Heart Rate(bpm):  138
 Cardiac Activity:       Observed
 Presentation:           Cephalic
 Placenta:               Posterior
 P. Cord Insertion:      Visualized, central

 Amniotic Fluid
 AFI FV:      Within normal limits

                             Largest Pocket(cm)

Biometry

 BPD:        73  mm     G. Age:  29w 2d         98  %    CI:        72.18   %    70 - 86
                                                         FL/HC:      17.5   %    18.6 -
 HC:      273.4  mm     G. Age:  29w 6d         98  %    HC/AC:      1.10        1.05 -
 AC:      247.5  mm     G. Age:  29w 0d         96  %    FL/BPD:     65.5   %    71 - 87
 FL:       47.8  mm     G. Age:  26w 0d         20  %    FL/AC:      19.3   %    20 - 24
 HUM:      44.2  mm     G. Age:  26w 2d         39  %
 CER:      32.4  mm     G. Age:  27w 5d         87  %
 LV:        2.5  mm
 CM:        6.4  mm

 Est. FW:    5532  gm      2 lb 9 oz     92  %
Gestational Age

 LMP:           27w 6d        Date:  06/18/20                 EDD:   03/25/21
 U/S Today:     28w 4d                                        EDD:   03/20/21
 Best:          26w 4d     Det. By:  Early Ultrasound         EDD:   04/03/21
                                     (08/21/20)
Anatomy

 Cranium:               Appears normal         Aortic Arch:            Appears normal
 Cavum:                 Appears normal         Ductal Arch:            Appears normal
 Ventricles:            Appears normal         Diaphragm:              Appears normal
 Choroid Plexus:        Appears normal         Stomach:                Appears normal, left
                                                                       sided
 Cerebellum:            Appears normal         Abdomen:                Appears normal
 Posterior Fossa:       Appears normal         Abdominal Wall:         Appears nml (cord
                                                                       insert, abd wall)
 Nuchal Fold:           Not applicable (>20    Cord Vessels:           Appears normal (3
                        wks GA)                                        vessel cord)
 Face:                  Orbits nl; profile not Kidneys:                Appear normal
                        well visualized
 Lips:                  Appears normal         Bladder:                Appears normal
 Thoracic:              Appears normal         Spine:                  Appears normal
 Heart:                 Appears normal         Upper Extremities:      Appears normal
                        (4CH, axis, and
                        situs)
 RVOT:                  Appears normal         Lower Extremities:      Appears normal
 LVOT:                  Appears normal

 Other:  Heels and 5th digit visualized. VC, 3VV and 3VTV visualized. Fetus
         appears to be a male.
Cervix Uterus Adnexa

 Cervix
 Length:           4.41  cm.
 Not visualized (advanced GA >73wks)

 Right Ovary
 Visualized.

 Left Ovary
 Visualized.
Comments

 This patient was seen for a detailed fetal anatomy scan as
 mild polyhydramnios was noted on the recent ultrasound
 performed in your office.  The patient reports that she
 recently moved to the [HOSPITAL] area from [place].
 Her pregnancy history is as follows:
 Her first child was delivered at term weighing 6 pounds 11
 ounces.
 Her second child was delivered at 39 weeks weighing 8
 pounds 4 ounces.  Polyhydramnios was noted during that
 pregnancy.
 Her third child was delivered at 39 weeks weighing 8 pounds
 8 ounces.  She developed significant proteinuria towards the
 end of that pregnancy.
 The patient reports that she is still breast-feeding her other
 child.  As a result, her periods are irregular.  Based on her
 calculations based on her LMP, her EDC should be March 27, 2021.  However, she has had ultrasounds earlier in her
 pregnancy that confirmed an EDC April 03, 2021.
 She denies any other significant past medical history and
 denies any problems in her current pregnancy.
 She had a cell free DNA test earlier in her pregnancy which
 indicated a low risk for trisomy 21, 18, and 13. A male fetus is
 predicted.
 Using an EDC April 03, 2021, the overall EFW
 measured today is in the upper normal range (92nd
 percentile).
 Borderline polyhydramnios with a maximal vertical pocket of
 between 7 to 8 cm is noted.
 There were no obvious fetal anomalies noted on today's
 ultrasound exam to account for the increased amniotic fluid.
 She was advised that mild polyhydramnios is most often a
 normal finding.  The increased risk of PPROM and preterm
 labor should her amniotic fluid level continue to increase was
 discussed.  She will be screened for gestational diabetes at
 her next prenatal visit.
 The patient was informed that anomalies may be missed due
 to technical limitations. If the fetus is in a suboptimal position
 or maternal habitus is increased, visualization of the fetus in
 the maternal uterus may be impaired.
 Due to borderline polyhydramnios, a follow-up growth scan
 was scheduled in our office in 4 weeks.
 Should polyhydramnios or a larger sized fetus continue to be
 noted later in her pregnancy, delivery may be considered at
 around 39 weeks.

## 2022-05-27 ENCOUNTER — Encounter (HOSPITAL_COMMUNITY): Payer: Self-pay | Admitting: Emergency Medicine

## 2022-05-27 ENCOUNTER — Ambulatory Visit (HOSPITAL_COMMUNITY)
Admission: EM | Admit: 2022-05-27 | Discharge: 2022-05-27 | Disposition: A | Discharge status: Home / Self Care | Payer: No Typology Code available for payment source

## 2022-05-27 DIAGNOSIS — M79601 Pain in right arm: Secondary | ICD-10-CM

## 2022-05-27 NOTE — ED Triage Notes (Signed)
Pt reports yesterday has raised bump on right forearm that is painful to touch. Reports notices blue veins on arms as well.  Reports year ago cousin dropped dead of something similar. Pt reports has severe anxiety and wants to make sure no blood clot in right arm. Did travel to [place] yesterday. Pain was worse yesterday.

## 2022-05-27 NOTE — ED Provider Notes (Signed)
[facility]   [id number] 05/27/22 Arrival Time: [id number]  ASSESSMENT & PLAN:  1. Right arm pain    -I had a long conversation today with the patient regarding her clinical presentation.  I certainly do not see any signs or symptoms of blood clot today.  She has no erythema, no warmth of the upper extremity.  She only has mild risk factors of uncertain family history and occasional smoking.  I gave her reassurance today.  I did offer to give her a referral for an outpatient ultrasound on a nonemergent basis if she would feel more comfortable having an ultrasound performed.  She declined this today and voiced pleasure knowing that her clinical picture was reassuring.  I encouraged her to take Tylenol, apply ice to the area, and establish with her PCP next week she can have further blood testing worked up.  ER precautions were given if she should developed erythema, swelling, warmth, chest pain or shortness of breath.  All questions were answered and she agrees to plan.  No orders of the defined types were placed in this encounter.    Discharge Instructions      Your pain seems musculoskeletal today I do not see any signs or symptoms of a blood clot See a provider if you have redness, swelling, or worsening pain  in the upper arm Talk with your PCP next week about this and lab testing It was nice to meet you today!        Reviewed expectations re: course of current medical issues. Questions answered. Outlined signs and symptoms indicating need for more acute intervention. Patient verbalized understanding. After Visit Summary given.   SUBJECTIVE: Pleasant 32 year old female comes to urgent care to be evaluated for right arm pain.  Yesterday she noticed in the right forearm she is having some tenderness at the dorsal aspect just distal to the elbow.  She thought it was a little bit swollen there and thought of the vein looked a little more noticeably blue on the right arm  compared to the left.  She comes in today to make sure she does not have a blood clot.    She denies any redness or swelling in the hands and fingers distally.  She was on a car ride yesterday for about 5 hours.  She had a family member passed away a year ago from an unknown cause.  She says her family has a history of some clotting disorders but she is unsure exactly what.  She is an occasional smoker only with alcohol, maybe 2 cigarettes a week.  She is not on any birth control pills.  She has not had a blood clot before.  She does have a history of what she thinks is psoriatic arthritis but has not had this diagnosis confirmed yet.  She has an upcoming appoint with a PCP to establish care on March 15.  Patient's last menstrual period was 05/14/2022. Past Surgical History:  Procedure Laterality Date   DRUG INDUCED ENDOSCOPY     WISDOM TOOTH EXTRACTION       OBJECTIVE:  Vitals:   05/27/22 0957  BP: 137/87  Pulse: 84  Resp: 17  Temp: 98.3 F (36.8 C)  TempSrc: Oral  SpO2: 97%     Physical Exam Vitals reviewed.  Constitutional:      General: She is not in acute distress.    Appearance: Normal appearance.  Cardiovascular:     Rate and Rhythm: Normal rate.  Pulmonary:  Effort: Pulmonary effort is normal.  Musculoskeletal:     Comments: Right forearm -no obvious deformity.  Maybe minimal soft tissue swelling dorsal aspect of the right forearm compared to the left.  No erythema.  No ecchymoses.  She is mildly tender to palpation at the extensor muscle bellies in the right forearm.  Nontender at the medial or lateral epicondyle or the olecranon.  Full range of motion at the elbow and flexion extension pronation and supination. Pain with wrist extension and flexion.  Full range of motion and strength in the fingers.  2+ radial pulse.  Neurovascular intact distally.  Neurological:     Mental Status: She is alert.      Labs: Results for orders placed or performed during the  hospital encounter of 03/22/21  Resp Panel by RT-PCR (Flu A&B, Covid) Nasopharyngeal Swab   Specimen: Nasopharyngeal Swab; Nasopharyngeal(NP) swabs in vial transport medium  Result Value Ref Range   SARS Coronavirus 2 by RT PCR NEGATIVE NEGATIVE   Influenza A by PCR NEGATIVE NEGATIVE   Influenza B by PCR NEGATIVE NEGATIVE  CBC  Result Value Ref Range   WBC 12.1 (H) 4.0 - 10.5 K/uL   RBC 4.03 3.87 - 5.11 MIL/uL   Hemoglobin 10.1 (L) 12.0 - 15.0 g/dL   HCT 32.8 (L) 36.0 - 46.0 %   MCV 81.4 80.0 - 100.0 fL   MCH 25.1 (L) 26.0 - 34.0 pg   MCHC 30.8 30.0 - 36.0 g/dL   RDW 12.5 11.5 - 15.5 %   Platelets 261 150 - 400 K/uL   nRBC 0.2 0.0 - 0.2 %  RPR  Result Value Ref Range   RPR Ser Ql NON REACTIVE NON REACTIVE  CBC  Result Value Ref Range   WBC 16.1 (H) 4.0 - 10.5 K/uL   RBC 3.77 (L) 3.87 - 5.11 MIL/uL   Hemoglobin 9.4 (L) 12.0 - 15.0 g/dL   HCT 30.0 (L) 36.0 - 46.0 %   MCV 79.6 (L) 80.0 - 100.0 fL   MCH 24.9 (L) 26.0 - 34.0 pg   MCHC 31.3 30.0 - 36.0 g/dL   RDW 12.6 11.5 - 15.5 %   Platelets 237 150 - 400 K/uL   nRBC 0.0 0.0 - 0.2 %  Type and screen [place]  Result Value Ref Range   ABO/RH(D) A POS    Antibody Screen NEG    Sample Expiration      03/26/2021,2359 Performed at [facility], [address] [place], [place] [address]    Labs Reviewed - No data to display  Imaging: No results found.   No Known Allergies                                             Past Medical History:  Diagnosis Date   Anxiety    Pregnancy induced hypertension     Social History   Socioeconomic History   Marital status: Married    Spouse name: Not on file   Number of children: Not on file   Years of education: Not on file   Highest education level: Not on file  Occupational History   Not on file  Tobacco Use   Smoking status: Never   Smokeless tobacco: Never  Vaping Use   Vaping Use: Never used  Substance and Sexual Activity   Alcohol use: Not  Currently   Drug use: Never   Sexual activity: Yes  Other Topics Concern   Not on file  Social History Narrative   Not on file   Social Determinants of Health   Financial Resource Strain: Not on file  Food Insecurity: Not on file  Transportation Needs: Not on file  Physical Activity: Not on file  Stress: Not on file  Social Connections: Not on file  Intimate Partner Violence: Not on file    Family History  Problem Relation Age of Onset   Hyperlipidemia Mother    Hyperlipidemia Father    Heart disease Father    Kidney disease Father    Stroke Maternal Grandmother    Cancer Paternal Grandmother    Stroke Paternal Grandfather       Lisa Frazier, Dorian Pod, MD 05/27/22 1148

## 2022-05-27 NOTE — Discharge Instructions (Signed)
Your pain seems musculoskeletal today I do not see any signs or symptoms of a blood clot See a provider if you have redness, swelling, or worsening pain  in the upper arm Talk with your PCP next week about this and lab testing It was nice to meet you today!

## 2022-06-25 IMAGING — US US MFM OB FOLLOW-UP
1 series · 13 of 28 positions shown · non-contrast
Comparison: none

[Series 1: us mfm ob follow-up · 59 acquisitions, 13 frames shown]
[im 3/59]
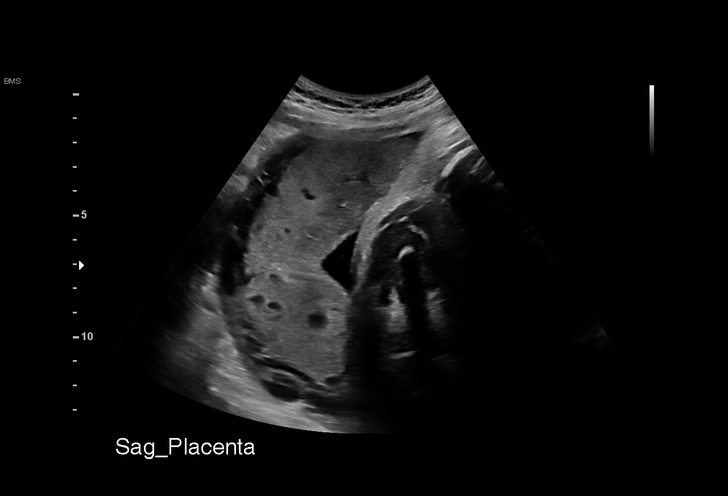
[im 7/59]
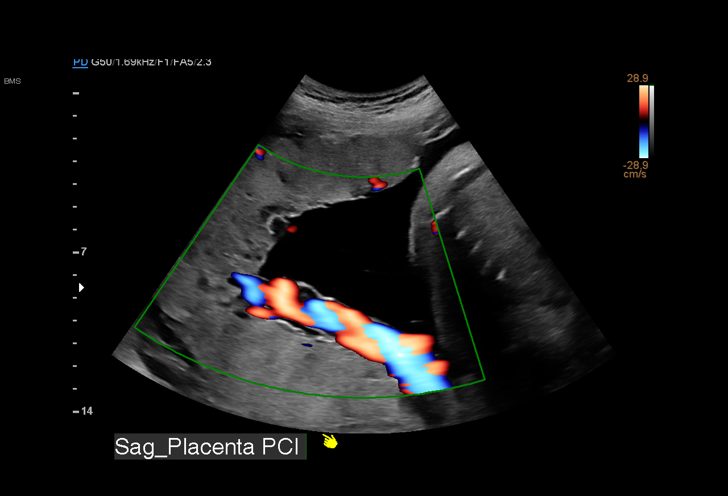
[im 11/59]
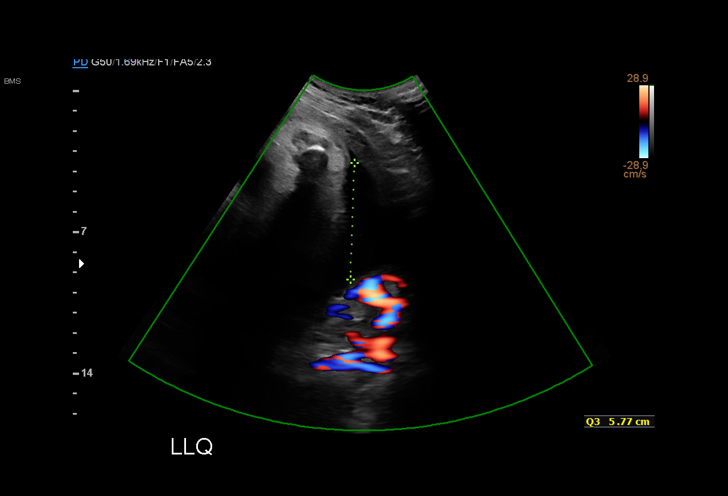
[im 16/59]
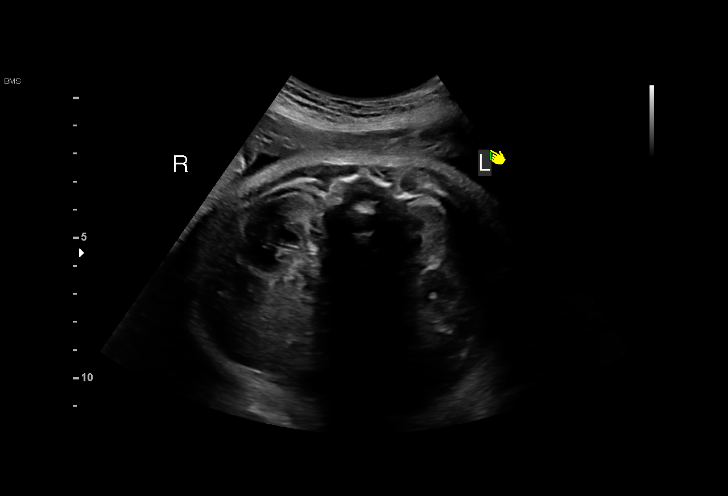
[im 20/59]
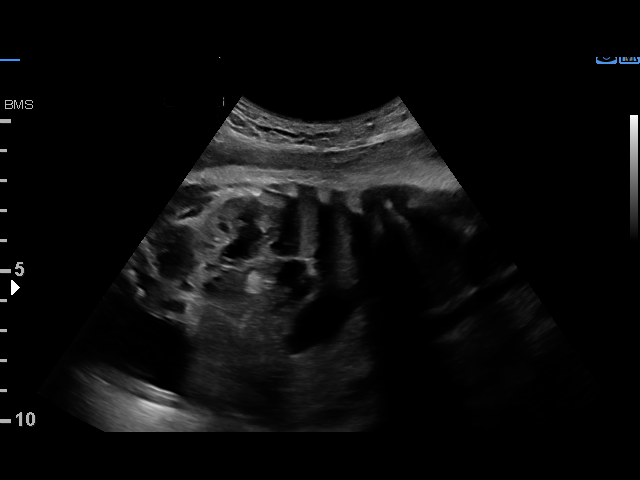
[im 24/59]
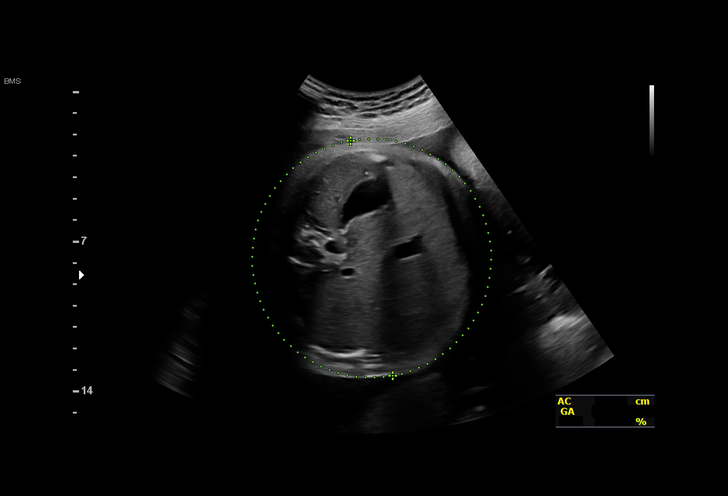
[im 31/59]
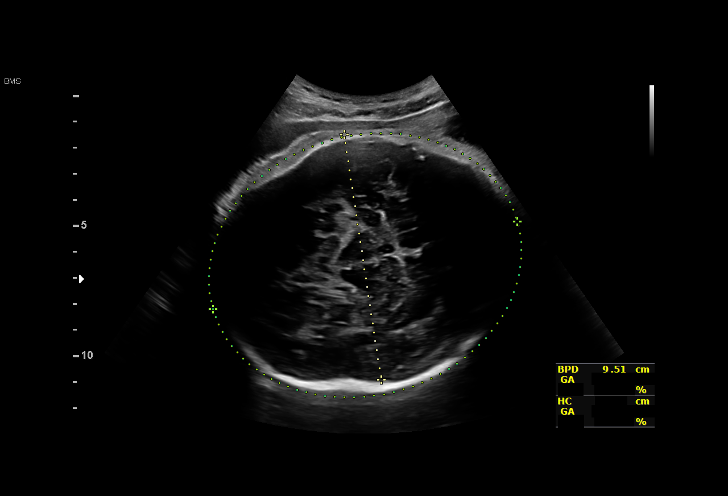
[im 35/59]
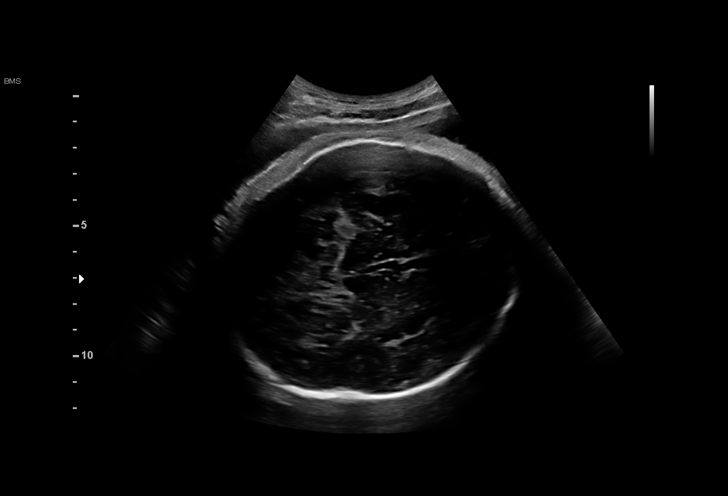
[im 39/59]
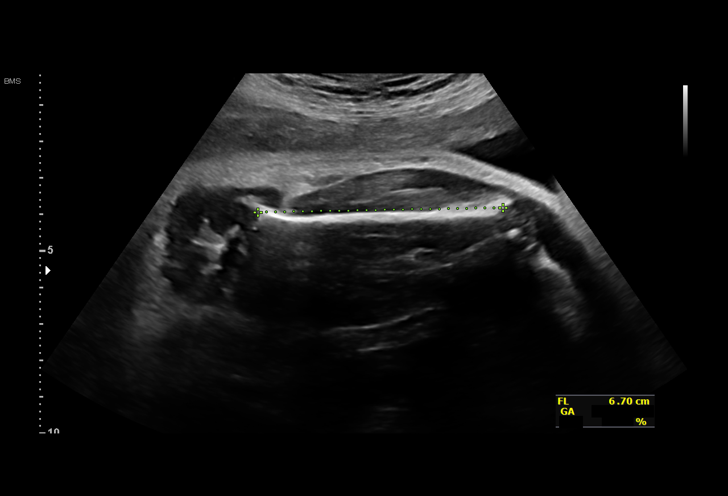
[im 43/59]
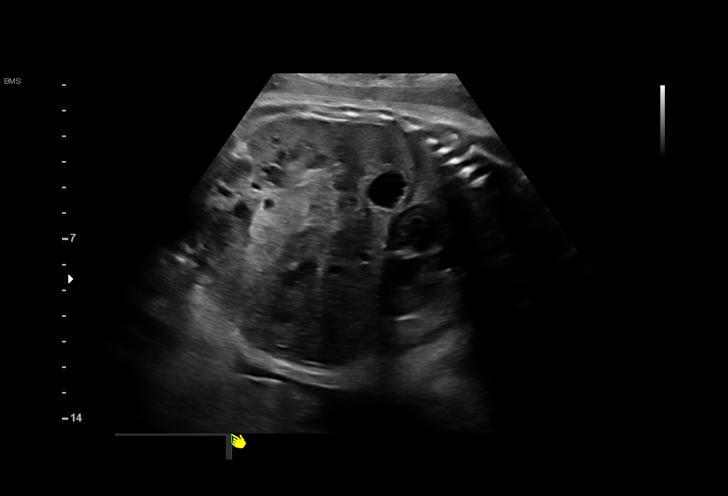
[im 48/59]
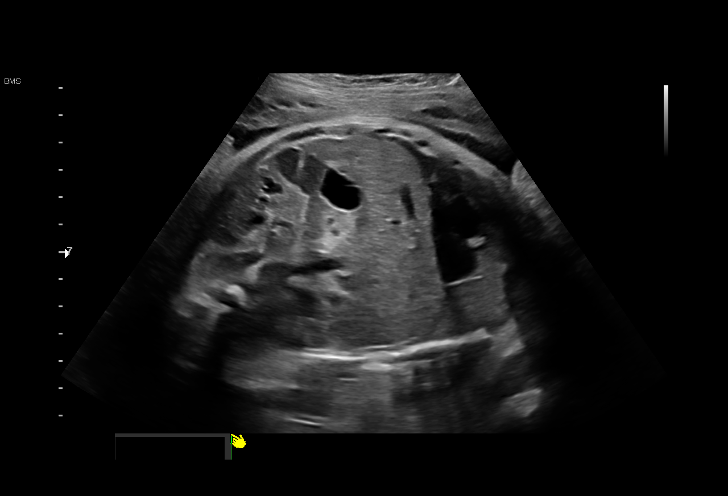
[im 52/59]
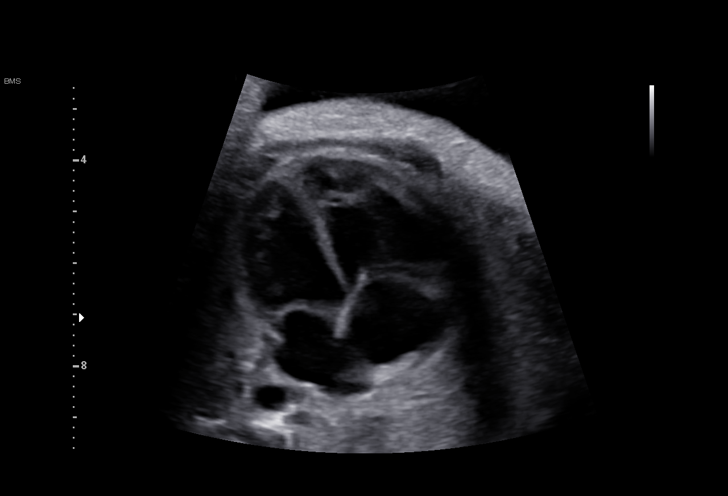
[im 56/59]
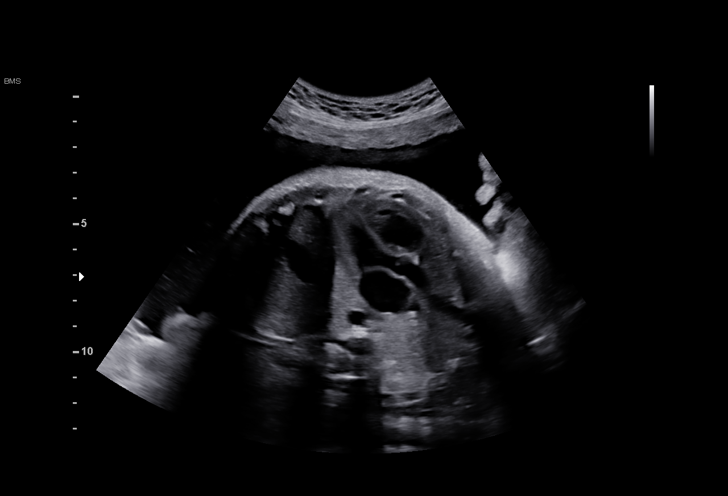

[13 of 28 positions shown; findings below may reference images not displayed]

W/NONSTRESS

Indications

 Polyhydramnios, third trimester, antepartum
 condition or complication, unspecified fetus
 35 weeks gestation of pregnancy
 Poor obstetric history: gestational HTN
Fetal Evaluation

 Num Of Fetuses:         1
 Fetal Heart Rate(bpm):  140
 Cardiac Activity:       Observed
 Presentation:           Cephalic
 Placenta:               Posterior
 P. Cord Insertion:      Visualized, central

 Amniotic Fluid
 AFI FV:      Polyhydramnios

 AFI Sum(cm)     %Tile       Largest Pocket(cm)
 26.48           96

 RUQ(cm)       RLQ(cm)       LUQ(cm)        LLQ(cm)

Biophysical Evaluation
 Amniotic F.V:   Polyhydramnios             F. Tone:        Observed
 F. Movement:    Observed                   N.S.T:          Reactive
 F. Breathing:   Not Observed               Score:          [DATE]
Biometry

 BPD:      96.7  mm     G. Age:  39w 4d       > 99  %    CI:        77.78   %    70 - 86
                                                         FL/HC:      19.1   %    20.1 -
 HC:       347   mm     G. Age:  40w 2d         98  %    HC/AC:      0.96        0.93 -
 AC:      363.1  mm     G. Age:  40w 2d       > 99  %    FL/BPD:     68.5   %    71 - 87
 FL:       66.2  mm     G. Age:  34w 1d         12  %    FL/AC:      18.2   %    20 - 24

 LV:        1.5  mm

 Est. FW:    7424  gm           8 lb   > 99  %
Gestational Age

 LMP:           36w 6d        Date:  06/18/20                 EDD:   03/25/21
 U/S Today:     38w 4d                                        EDD:   03/13/21
 Best:          35w 4d     Det. By:  Early Ultrasound         EDD:   04/03/21
                                     (08/21/20)
Anatomy

 Cranium:               Appears normal         Aortic Arch:            Appears normal
 Cavum:                 Appears normal         Ductal Arch:            Previously seen
 Ventricles:            Appears normal         Diaphragm:              Previously seen
 Choroid Plexus:        Previously seen        Stomach:                Appears normal, left
                                                                       sided
 Cerebellum:            Previously seen        Abdomen:                Previously seen
 Posterior Fossa:       Previously seen        Abdominal Wall:         Previously seen
 Nuchal Fold:           Not applicable (>20    Cord Vessels:           Previously seen
                        wks GA)
 Face:                  Orbits nl; profile     Kidneys:                Appear normal
                        limited
 Lips:                  Previously seen        Bladder:                Appears normal
 Thoracic:              Previously seen        Spine:                  Previously seen
 Heart:                 Appears normal         Upper Extremities:      Previously seen
                        (4CH, axis, and
                        situs)
 RVOT:                  Previously seen        Lower Extremities:      Previously seen
 LVOT:                  Previously seen

 Other:  Heels and 5th digit previously visualized. VC, 3VV and 3VTV
         previously visualized. Fetus appears to be a male.
Cervix Uterus Adnexa

 Cervix
 Not visualized (advanced GA >01wks)
Comments

 This patient was seen for a follow up growth scan due to a
 large for gestational age fetus and borderline polyhydramnios
 noted on her prior exams.  She denies any problems since
 her last exam and has screened negative for gestational
 diabetes.
 She was informed that the fetal growth continues to measure
 large for her gestational age (greater than 99th percentile).
 Mild polyhydramnios with a total AFI of 26.5 centimeters was
 noted.
 A biophysical profile performed today was [DATE].  The
 fetus received a -2 for fetal breathing movements that did not
 meet criteria.  She subsequently had a reactive NST making
 her total biophysical profile score [DATE].
 Due to mild polyhydramnios, we will continue to follow her
 with weekly fetal testing.
 Another BPP was scheduled in 1 week.
